# Patient Record
Sex: Female | Born: 1945 | Race: White | Hispanic: No | Marital: Married | Smoking: Never smoker
Health system: Southern US, Community
[De-identification: ages and names within clinical notes are randomized; demographics above are authoritative.]

## PROBLEM LIST (undated history)

## (undated) DIAGNOSIS — E785 Hyperlipidemia, unspecified: Secondary | ICD-10-CM

## (undated) HISTORY — DX: Hyperlipidemia, unspecified: E78.5

## (undated) SURGERY — Surgical Case
Anesthesia: *Unknown

---

## 1990-10-19 HISTORY — PX: OTHER SURGICAL HISTORY: SHX169

## 1999-01-06 ENCOUNTER — Other Ambulatory Visit: Admission: RE | Admit: 1999-01-06 | Discharge: 1999-01-06 | Payer: Self-pay | Admitting: Obstetrics and Gynecology

## 2000-10-28 ENCOUNTER — Other Ambulatory Visit: Admission: RE | Admit: 2000-10-28 | Discharge: 2000-10-28 | Payer: Self-pay | Admitting: Obstetrics and Gynecology

## 2001-11-07 ENCOUNTER — Other Ambulatory Visit: Admission: RE | Admit: 2001-11-07 | Discharge: 2001-11-07 | Payer: Self-pay | Admitting: Obstetrics and Gynecology

## 2002-11-08 ENCOUNTER — Other Ambulatory Visit: Admission: RE | Admit: 2002-11-08 | Discharge: 2002-11-08 | Payer: Self-pay | Admitting: Obstetrics and Gynecology

## 2003-11-12 ENCOUNTER — Other Ambulatory Visit: Admission: RE | Admit: 2003-11-12 | Discharge: 2003-11-12 | Payer: Self-pay | Admitting: Obstetrics and Gynecology

## 2004-11-12 ENCOUNTER — Other Ambulatory Visit: Admission: RE | Admit: 2004-11-12 | Discharge: 2004-11-12 | Payer: Self-pay | Admitting: Obstetrics and Gynecology

## 2005-11-17 ENCOUNTER — Other Ambulatory Visit: Admission: RE | Admit: 2005-11-17 | Discharge: 2005-11-17 | Payer: Self-pay | Admitting: Obstetrics and Gynecology

## 2006-04-02 ENCOUNTER — Ambulatory Visit: Payer: Self-pay | Admitting: Internal Medicine

## 2006-06-02 ENCOUNTER — Encounter: Admission: RE | Admit: 2006-06-02 | Discharge: 2006-06-02 | Payer: Self-pay | Admitting: Surgery

## 2006-06-04 ENCOUNTER — Ambulatory Visit (HOSPITAL_BASED_OUTPATIENT_CLINIC_OR_DEPARTMENT_OTHER): Admission: RE | Admit: 2006-06-04 | Discharge: 2006-06-04 | Payer: Self-pay | Admitting: Surgery

## 2006-06-04 ENCOUNTER — Encounter (INDEPENDENT_AMBULATORY_CARE_PROVIDER_SITE_OTHER): Payer: Self-pay | Admitting: Specialist

## 2006-10-18 ENCOUNTER — Ambulatory Visit: Payer: Self-pay | Admitting: Pulmonary Disease

## 2006-10-18 ENCOUNTER — Inpatient Hospital Stay (HOSPITAL_COMMUNITY): Admission: EM | Admit: 2006-10-18 | Discharge: 2006-10-19 | Payer: Self-pay | Admitting: Emergency Medicine

## 2006-11-23 ENCOUNTER — Other Ambulatory Visit: Admission: RE | Admit: 2006-11-23 | Discharge: 2006-11-23 | Payer: Self-pay | Admitting: Obstetrics and Gynecology

## 2007-08-25 ENCOUNTER — Ambulatory Visit: Payer: Self-pay | Admitting: Internal Medicine

## 2007-09-22 ENCOUNTER — Ambulatory Visit: Payer: Self-pay | Admitting: Internal Medicine

## 2007-12-19 ENCOUNTER — Other Ambulatory Visit: Admission: RE | Admit: 2007-12-19 | Discharge: 2007-12-19 | Payer: Self-pay | Admitting: Obstetrics and Gynecology

## 2008-08-15 ENCOUNTER — Ambulatory Visit: Payer: Self-pay | Admitting: Internal Medicine

## 2008-11-22 ENCOUNTER — Ambulatory Visit: Payer: Self-pay | Admitting: Internal Medicine

## 2008-11-22 DIAGNOSIS — R030 Elevated blood-pressure reading, without diagnosis of hypertension: Secondary | ICD-10-CM

## 2008-11-22 DIAGNOSIS — M542 Cervicalgia: Secondary | ICD-10-CM | POA: Insufficient documentation

## 2008-11-22 DIAGNOSIS — R519 Headache, unspecified: Secondary | ICD-10-CM | POA: Insufficient documentation

## 2008-11-22 DIAGNOSIS — R51 Headache: Secondary | ICD-10-CM

## 2009-01-23 ENCOUNTER — Other Ambulatory Visit: Admission: RE | Admit: 2009-01-23 | Discharge: 2009-01-23 | Payer: Self-pay | Admitting: Obstetrics and Gynecology

## 2009-01-23 ENCOUNTER — Ambulatory Visit: Payer: Self-pay | Admitting: Obstetrics and Gynecology

## 2009-01-23 ENCOUNTER — Encounter: Payer: Self-pay | Admitting: Obstetrics and Gynecology

## 2009-08-12 ENCOUNTER — Ambulatory Visit: Payer: Self-pay | Admitting: Internal Medicine

## 2009-12-05 ENCOUNTER — Encounter: Admission: RE | Admit: 2009-12-05 | Discharge: 2009-12-05 | Payer: Self-pay | Admitting: Obstetrics and Gynecology

## 2010-02-24 ENCOUNTER — Ambulatory Visit: Payer: Self-pay | Admitting: Obstetrics and Gynecology

## 2010-02-24 ENCOUNTER — Other Ambulatory Visit: Admission: RE | Admit: 2010-02-24 | Discharge: 2010-02-24 | Payer: Self-pay | Admitting: Obstetrics and Gynecology

## 2010-03-31 ENCOUNTER — Ambulatory Visit: Payer: Self-pay | Admitting: Internal Medicine

## 2010-03-31 DIAGNOSIS — Z902 Acquired absence of lung [part of]: Secondary | ICD-10-CM

## 2010-03-31 DIAGNOSIS — G4736 Sleep related hypoventilation in conditions classified elsewhere: Secondary | ICD-10-CM

## 2010-03-31 DIAGNOSIS — Z9889 Other specified postprocedural states: Secondary | ICD-10-CM | POA: Insufficient documentation

## 2010-04-07 ENCOUNTER — Telehealth (INDEPENDENT_AMBULATORY_CARE_PROVIDER_SITE_OTHER): Payer: Self-pay | Admitting: *Deleted

## 2010-04-09 ENCOUNTER — Encounter: Payer: Self-pay | Admitting: Internal Medicine

## 2010-04-14 ENCOUNTER — Ambulatory Visit: Payer: Self-pay | Admitting: Obstetrics and Gynecology

## 2010-08-04 ENCOUNTER — Ambulatory Visit: Payer: Self-pay | Admitting: Internal Medicine

## 2010-11-18 NOTE — Assessment & Plan Note (Signed)
Summary: sleep apnea?/cbs   Vital Signs:  Patient profile:   65 year old female Weight:      149 pounds Temp:     98.6 degrees F oral Pulse rate:   64 / minute Resp:     15 per minute BP sitting:   124 / 78  (left arm) Cuff size:   regular  Vitals Entered By: Shonna Chock (March 31, 2010 3:14 PM) CC: Sleep Concerns per patient's husband   CC:  Sleep Concerns per patient's husband.  History of Present Illness: Her husband is concerned she may have Sleep Apnea; she  may "snort" according to him. He had considered buying a pulse oximetry. She is unaware of any issues with sleep , but admits to breathing "shallowly".  Allergies (verified): No Known Drug Allergies  Review of Systems General:  Denies fatigue. ENT:  Complains of postnasal drainage. CV:  Complains of shortness of breath with exertion; denies difficulty breathing at night, difficulty breathing while lying down, swelling of feet, and swelling of hands; DOE in Summer due to absence of 1 lung. Resp:  Denies cough, excessive snoring, hypersomnolence, morning headaches, shortness of breath, and sputum productive. Neuro:  paresthesias R posterior neck occasionally. Allergy:  Denies itching eyes and sneezing.  Physical Exam  General:  well-nourished,in no acute distress; alert,appropriate and cooperative throughout examination Nose:  External nasal examination shows no deformity or inflammation. Nasal mucosa are pink and moist without lesions or exudates. Mouth:  Oral mucosa and oropharynx without lesions or exudates.  Teeth in good repair. Neck:  Trach scar ant neck Lungs:  Bronchovesicular BS RUL posteriorly; L lung clear Heart:  Normal rate and regular rhythm. S1 and S2 normal without gallop, murmur, click, rub,S4 Extremities:  No clubbing, cyanosis, edema. Neurologic:  alert & oriented X3, strength normal in all extremities, and DTRs symmetrical and normal.     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 Referral (Misc. 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 Referral (Misc. Ref)  Patient Instructions: 1)  Neti pot once daily as needed for head congestion. Keep diary of neck symptoms to establish any triggers.

## 2010-11-18 NOTE — Procedures (Signed)
Summary: Oximetry/[facility]  Oximetry/[facility]   Imported By: Lanelle Bal 04/28/2010 12:16:41  _____________________________________________________________________  External Attachment:    Type:   Image     Comment:   External Document

## 2010-11-18 NOTE — Progress Notes (Signed)
Summary: Status of Overnight O2 set-up  Phone Note Call from Patient Call back at Home Phone [phone]   Caller: Patient Summary of Call: Message left on  Triage VM: Patient was suppose to have over night O2 Study and has not heard anything.   I reviewed orders Tab, Betsy from [facility] was contacted and was to futher f/u with the patient.  I called Betsy at [phone], Corky Mull will follow-up and indicated patient should of been contacted by now but she will make sure overnight O2 Study is set up.  I called patient and left message on her VM informing her [facility] will contact her. Initial call taken by: Shonna Chock,  April 07, 2010 11:34 AM

## 2010-11-18 NOTE — Assessment & Plan Note (Signed)
Summary: flu shot/kn  Nurse Visit  CC: Flu shot./kb   Allergies: No Known Drug Allergies  Orders Added: 1)  Admin 1st Vaccine [90471] 2)  Flu Vaccine 4yrs + [16109]          Flu Vaccine Consent Questions     Do you have a history of severe allergic reactions to this vaccine? no    Any prior history of allergic reactions to egg and/or gelatin? no    Do you have a sensitivity to the preservative Thimersol? no    Do you have a past history of Guillan-Barre Syndrome? no    Do you currently have an acute febrile illness? no    Have you ever had a severe reaction to latex? no    Vaccine information given and explained to patient? yes    Are you currently pregnant? no    Lot Number:[id number]   Exp Date:04/18/2011   Site Given  Left Deltoid IMu

## 2011-03-06 NOTE — Op Note (Signed)
Erika Walker, Erika Walker             ACCOUNT NO.:  [id number]   MEDICAL RECORD NO.:  [id number]          PATIENT TYPE:  AMB   LOCATION:  [facility]                         FACILITY:  [facility]   PHYSICIAN:  Thomas A. Cornett, M.D.DATE OF BIRTH:  [birth year]   DATE OF PROCEDURE:  06/04/2006  DATE OF DISCHARGE:                                 OPERATIVE REPORT   PREOPERATIVE DIAGNOSIS:  Lipoma left lower back measuring 4 x 5 cm.   POSTOPERATIVE DIAGNOSIS:  Lipoma left lower back measuring 4 x 5 cm.   PROCEDURE:  Excision of lipoma lower back left measuring 4 x 5 cm.   SURGEON:  Maisie Fus A. Cornett, M.D.   ANESTHESIA:  Is MAC with 30 mL of 0.25% Sensorcaine with epinephrine.   SPECIMEN:  Is a 4 x 5 cm mass consistent with lipoma to pathology.   DRAINS:  None.   INDICATIONS FOR PROCEDURE:  The patient is a 65 year old female who has a  slowly enlarging mass to her left lower back.  This was diagnosed years ago  and felt to be consistent with a lipoma.  It has gotten larger and she  wished to have it removed due to discomfort.   DESCRIPTION OF PROCEDURE:  The patient brought to the operating room, placed  prone.  MAC anesthesia initiated and left lower back was prepped, draped in  sterile fashion.  0.25% Sensorcaine was used for local anesthesia.  A  vertical incision was made just left of the midline over the mass.  Dissection was carried down and I encountered a large lobulated fatty mass  which I excised in its entirety in the subcutaneous tissues.  This was sent  to pathology for further evaluation.  No irrigation was used.  Wound was  cleaned with no evidence of bleeding.  I closed in layers using 3-0 Vicryl  for deep layer to close the dead space and a 4-0 Monocryl for skin.  Steri-  Strips and dry dressings were applied.  All final counts, sponge, needle and  instruments were found be correct at this portion of the case.  The patient  was awoke and taken to recovery in satisfactory  condition.      Thomas A. Cornett, M.D.  Electronically Signed     TAC/MEDQ  D:  06/04/2006  T:  06/04/2006  Job:  [id number]   cc:   Reuel Boom L. Eda Paschal, M.D.  Fax: [phone]

## 2011-03-06 NOTE — Discharge Summary (Signed)
[name], Erika Walker NO.:  [id number]   MEDICAL RECORD NO.:  [id number]          PATIENT TYPE:  INP   LOCATION:  2927                         FACILITY:  [facility]   PHYSICIAN:  Gailen Shelter, MD  DATE OF BIRTH:  11-20-1945   DATE OF ADMISSION:  10/18/2006  DATE OF DISCHARGE:  10/19/2006                               DISCHARGE SUMMARY   DISCHARGE DIAGNOSES:  1. Acute hypercarbic respiratory failure due to upper airway      obstruction.  2. Benign aspiration due to food bolus aspiration.  3. Negative pressure pulmonary edema secondary to upper airway      obstruction resolved.  4. Status post traumatic right pneumonectomy.   BRIEF HISTORY AND PHYSICAL:  This is a 65 year old white female status  post traumatic right pneumonectomy in 1992 presented to the emergency  room nearly apneic with myoclonic jerks and cyanosis after choking on  spaghetti.  The ED P.M.D. gave her Ativan to relax the airway and used a  GlideScope to attempt intubation at that time.  He suctioned large  amounts of spaghetti from the patient's pharynx.  At that time, the  patient gasped and resumed normal breathing.  The patient did not  require intubation at that point.  For further details of the history  and physical, please refer to the note on the chart dated October 18, 2006.   BRIEF HOSPITAL COURSE:  The patient was admitted for observation status.  She was noted to have a left lower lobe infiltrate initially.  Subsequent chest x-ray on the day of discharge was noted to be clear.  Her arterial blood gases were also to her baseline.  Her examination at  that point was benign according to the rounding physician.  For this  purposes, the patient resolved the above issues and showed no sequelae  of her aspiration episode.   She was discharged to home in stable and improved condition.  She was  shown her preadmission medications which included only multivitamins.  Next followup was to  be done with either Dr. Drue Novel or Dr. Alwyn Ren whom the  patient identifies as her primary care physicians.      Gailen Shelter, MD  Electronically Signed     CLG/MEDQ  D:  12/13/2006  T:  12/13/2006  Job:  [id number]   cc:   Willow Ora, MD  Titus Dubin. Alwyn Ren, MD,FACP,FCCP

## 2011-03-12 ENCOUNTER — Encounter: Payer: Self-pay | Admitting: Obstetrics and Gynecology

## 2011-03-19 ENCOUNTER — Other Ambulatory Visit: Payer: Self-pay | Admitting: Obstetrics and Gynecology

## 2011-03-19 ENCOUNTER — Encounter (INDEPENDENT_AMBULATORY_CARE_PROVIDER_SITE_OTHER): Payer: BC Managed Care – PPO | Admitting: Obstetrics and Gynecology

## 2011-03-19 ENCOUNTER — Other Ambulatory Visit (HOSPITAL_COMMUNITY)
Admission: RE | Admit: 2011-03-19 | Discharge: 2011-03-19 | Disposition: A | Discharge status: Home / Self Care | Payer: BC Managed Care – PPO | Source: Ambulatory Visit | Attending: Obstetrics and Gynecology | Admitting: Obstetrics and Gynecology

## 2011-03-19 DIAGNOSIS — R82998 Other abnormal findings in urine: Secondary | ICD-10-CM

## 2011-03-19 DIAGNOSIS — Z124 Encounter for screening for malignant neoplasm of cervix: Secondary | ICD-10-CM | POA: Insufficient documentation

## 2011-03-19 DIAGNOSIS — Z01419 Encounter for gynecological examination (general) (routine) without abnormal findings: Secondary | ICD-10-CM

## 2011-06-08 ENCOUNTER — Encounter: Payer: Self-pay | Admitting: Family Medicine

## 2011-06-08 ENCOUNTER — Ambulatory Visit (INDEPENDENT_AMBULATORY_CARE_PROVIDER_SITE_OTHER)
Admission: RE | Admit: 2011-06-08 | Discharge: 2011-06-08 | Disposition: A | Discharge status: Home / Self Care | Payer: BC Managed Care – PPO | Source: Ambulatory Visit | Attending: Family Medicine | Admitting: Family Medicine

## 2011-06-08 ENCOUNTER — Ambulatory Visit (INDEPENDENT_AMBULATORY_CARE_PROVIDER_SITE_OTHER): Payer: BC Managed Care – PPO | Admitting: Family Medicine

## 2011-06-08 VITALS — BP 144/90 | Temp 98.8°F | Wt 151.2 lb

## 2011-06-08 DIAGNOSIS — R05 Cough: Secondary | ICD-10-CM

## 2011-06-08 MED ORDER — MOMETASONE FUROATE 50 MCG/ACT NA SUSP: 2.0000 | Freq: Every day | NASAL | Status: DC

## 2011-06-08 NOTE — Progress Notes (Signed)
  Subjective:    Patient ID: Erika Walker, female    DOB: 1946-02-14, 65 y.o.   MRN: [id number]  HPI Cough- sxs started 10 days ago.  Reports feeling well but cough unchanged.  Cough is intermittently productive.  No fever.  Minimal nasal congestion.  Will wake at 5am coughing.  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  No known sick contacts.  Hx of 'ongoing nasal drip'   Review of Systems For ROS see HPI     Objective:   Physical Exam  Vitals reviewed. Constitutional: She appears well-developed and well-nourished.  HENT:  Head: Normocephalic and atraumatic.  Nose: Nose normal.       No TTP over sinuses TMs normal bilaterally + PND  Eyes: Conjunctivae and EOM are normal. Pupils are equal, round, and reactive to light.  Neck: Normal range of motion. Neck supple.  Cardiovascular: Normal rate, regular rhythm and normal heart sounds.   Pulmonary/Chest: Effort normal. No respiratory distress. She has no wheezes. She has no rales.       Absent breath sounds on R- s/p pneumonectomy  Lymphadenopathy:    She has no cervical adenopathy.          Assessment & Plan:

## 2011-06-08 NOTE — Patient Instructions (Signed)
We will notify you of your xray results and start antibiotics as needed Start the nasal spray- 2 sprays each nostril daily Add Claritin or Zyrtec daily for control of postnasal drainage- store brands work just as well Drink plenty of fluids Call with any questions or concerns Hang in there!!!

## 2011-06-09 ENCOUNTER — Telehealth: Payer: Self-pay

## 2011-06-09 NOTE — Assessment & Plan Note (Signed)
Most likely due to untreated PND but given fact pt only has 1 lung will get CXR to assess.  If infxn on xray will proceed w/ abx.  Otherwise will use nasal steroid, OTC antihistamine to tx PND.  Reviewed supportive care and red flags that should prompt return.  Pt expressed understanding and is in agreement w/ plan.

## 2011-06-09 NOTE — Telephone Encounter (Signed)
Pt.notified

## 2011-06-09 NOTE — Telephone Encounter (Signed)
Message copied by Beverely Low on Tue Jun 09, 2011  8:29 AM ------      Message from: Sheliah Hatch      Created: Mon Jun 08, 2011  4:49 PM       CXR is clear- no evidence of infection.  This is good news!  Please call pt.

## 2011-07-15 ENCOUNTER — Telehealth: Payer: Self-pay | Admitting: Internal Medicine

## 2011-07-15 NOTE — Telephone Encounter (Signed)
Last pneumonia 08-25-07.Please advise

## 2011-07-15 NOTE — Telephone Encounter (Signed)
Pneumonia vaccine up to date; I strongly recommend the shingles vaccine. Shingles can be  extremely painful.

## 2011-07-15 NOTE — Telephone Encounter (Signed)
Left message to call office

## 2011-07-16 NOTE — Telephone Encounter (Signed)
Pt aware.

## 2011-07-22 ENCOUNTER — Ambulatory Visit (INDEPENDENT_AMBULATORY_CARE_PROVIDER_SITE_OTHER): Payer: BC Managed Care – PPO

## 2011-07-22 DIAGNOSIS — Z23 Encounter for immunization: Secondary | ICD-10-CM

## 2011-08-18 ENCOUNTER — Ambulatory Visit (INDEPENDENT_AMBULATORY_CARE_PROVIDER_SITE_OTHER): Payer: BC Managed Care – PPO

## 2011-08-18 DIAGNOSIS — Z23 Encounter for immunization: Secondary | ICD-10-CM

## 2012-03-23 ENCOUNTER — Encounter: Payer: Self-pay | Admitting: Obstetrics and Gynecology

## 2012-03-23 ENCOUNTER — Ambulatory Visit (INDEPENDENT_AMBULATORY_CARE_PROVIDER_SITE_OTHER): Payer: Medicare Other | Admitting: Obstetrics and Gynecology

## 2012-03-23 VITALS — BP 120/76 | Ht 66.0 in | Wt 150.0 lb

## 2012-03-23 DIAGNOSIS — Z01419 Encounter for gynecological examination (general) (routine) without abnormal findings: Secondary | ICD-10-CM | POA: Diagnosis not present

## 2012-03-23 DIAGNOSIS — R350 Frequency of micturition: Secondary | ICD-10-CM | POA: Diagnosis not present

## 2012-03-23 DIAGNOSIS — N952 Postmenopausal atrophic vaginitis: Secondary | ICD-10-CM | POA: Diagnosis not present

## 2012-03-23 NOTE — Progress Notes (Signed)
Patient came to see me today for further followup. She is very healthy without any significant problems. She takes no medication. She is having elevated cholesterol in the past which she will address  with Dr. Alwyn Ren. She is having no hot flashes. She is having no vaginal bleeding. She is having no pelvic pain. She does have vaginal dryness but uses lubricant with intercourse. She had a normal bone density in 2009. She is due for a mammogram. We have discussed colonoscopy which she has not done yet. She does have some urgency of urination without incontinence. She has no dysuria or frequency or hematuria. Patient has never had an abnormal Pap smear.  ROS: 12 system review done. 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 Neck pain. Chronic cough. All other pertinent positives above.  Physical examination: Kennon Portela present. HEENT within normal limits. Neck: Thyroid not large. No masses. Supraclavicular nodes: not enlarged. Breasts: Examined in both sitting and lying  position. No skin changes and no masses. Abdomen: Soft no guarding rebound or masses or hernia. Pelvic: External: Within normal limits. BUS: Within normal limits. Vaginal:within normal limits. poor estrogen effect. No evidence of cystocele rectocele or enterocele. Cervix: clean. Uterus: Normal size and shape. Adnexa: No masses. Rectovaginal exam: Confirmatory and negative. Extremities: Within normal limits.  Assessment: #1. Atrophic vaginitis #2. Urgency of urination  Plan: Call when necessary need for treatment of above. Mammogram. Colonoscopy.

## 2012-03-24 LAB — URINALYSIS W MICROSCOPIC + REFLEX CULTURE
Bilirubin Urine: NEGATIVE
Casts: NONE SEEN
Crystals: NONE SEEN
Ketones, ur: NEGATIVE mg/dL
Nitrite: NEGATIVE
Protein, ur: NEGATIVE mg/dL
Squamous Epithelial / LPF: NONE SEEN
Urobilinogen, UA: 0.2 mg/dL (ref 0.0–1.0)
pH: 5.5 (ref 5.0–8.0)

## 2012-04-25 DIAGNOSIS — H43819 Vitreous degeneration, unspecified eye: Secondary | ICD-10-CM | POA: Diagnosis not present

## 2012-04-25 DIAGNOSIS — H2510 Age-related nuclear cataract, unspecified eye: Secondary | ICD-10-CM | POA: Diagnosis not present

## 2012-04-25 DIAGNOSIS — H5200 Hypermetropia, unspecified eye: Secondary | ICD-10-CM | POA: Diagnosis not present

## 2012-04-25 DIAGNOSIS — H524 Presbyopia: Secondary | ICD-10-CM | POA: Diagnosis not present

## 2012-04-29 ENCOUNTER — Other Ambulatory Visit: Payer: Self-pay

## 2012-04-29 DIAGNOSIS — D485 Neoplasm of uncertain behavior of skin: Secondary | ICD-10-CM | POA: Diagnosis not present

## 2012-04-29 DIAGNOSIS — D1800 Hemangioma unspecified site: Secondary | ICD-10-CM | POA: Diagnosis not present

## 2012-04-29 DIAGNOSIS — L819 Disorder of pigmentation, unspecified: Secondary | ICD-10-CM | POA: Diagnosis not present

## 2012-07-29 ENCOUNTER — Telehealth: Payer: Self-pay | Admitting: Internal Medicine

## 2012-07-29 NOTE — Telephone Encounter (Signed)
pt needs to know when she is due for her Pneumonia shot & if she is due can she get it with her Flu Shot--Please review and advise and I will call her back to schedule ALSO note pt stated she only has 1-lung Cb# [phone]

## 2012-07-29 NOTE — Telephone Encounter (Signed)
Pneumonia vaccine is recommended once after age 66. I recommend flu and pneumonia vaccines be given @ different times in case there is any significant rash or fever following injection.

## 2012-08-03 ENCOUNTER — Telehealth: Payer: Self-pay | Admitting: Internal Medicine

## 2012-08-03 NOTE — Telephone Encounter (Signed)
Patient can get Flu vaccine and pneumonia vaccine (NOT AT THE SAME TIME) 1-2 weeks apart   Please contact patient and schedule appointment

## 2012-08-03 NOTE — Telephone Encounter (Signed)
Pt called back LM for me to call-called pt at 139pm  Flu 10.29 & PNA 11.12

## 2012-08-03 NOTE — Telephone Encounter (Signed)
done

## 2012-08-03 NOTE — Telephone Encounter (Signed)
Message copied by Verner Chol on Wed Aug 03, 2012  1:42 PM ------      Message from: [place], [place]      Created: Wed Aug 03, 2012 12:07 PM      Regarding: pt returning call      Contact: [phone]       Pt returning your call- wants to make appts- I could have done this but wanted to confirm with you first to make sure there wasn't anything additional going on. amy

## 2012-08-03 NOTE — Telephone Encounter (Signed)
Called pt at number given, left mess to call back & schedule flu & shingles vaccine at least 2-wks apart-smc

## 2012-08-08 ENCOUNTER — Telehealth: Payer: Self-pay | Admitting: Internal Medicine

## 2012-08-08 NOTE — Telephone Encounter (Signed)
DONE

## 2012-08-08 NOTE — Telephone Encounter (Signed)
Message copied by Verner Chol on Mon Aug 08, 2012  9:03 AM ------      Message from: [place], [place]      Created: Wed Aug 03, 2012 12:07 PM      Regarding: pt returning call      Contact: [phone]       Pt returning your call- wants to make appts- I could have done this but wanted to confirm with you first to make sure there wasn't anything additional going on. amy

## 2012-08-16 ENCOUNTER — Ambulatory Visit (INDEPENDENT_AMBULATORY_CARE_PROVIDER_SITE_OTHER): Payer: Medicare Other

## 2012-08-16 DIAGNOSIS — Z23 Encounter for immunization: Secondary | ICD-10-CM | POA: Diagnosis not present

## 2012-08-30 ENCOUNTER — Ambulatory Visit (INDEPENDENT_AMBULATORY_CARE_PROVIDER_SITE_OTHER): Payer: Medicare Other

## 2012-08-30 DIAGNOSIS — Z23 Encounter for immunization: Secondary | ICD-10-CM

## 2012-10-19 HISTORY — PX: COLONOSCOPY: SHX174

## 2012-11-23 ENCOUNTER — Emergency Department (HOSPITAL_COMMUNITY)
Admission: EM | Admit: 2012-11-23 | Discharge: 2012-11-23 | Disposition: A | Discharge status: Home / Self Care | Payer: Medicare Other | Attending: Emergency Medicine | Admitting: Emergency Medicine

## 2012-11-23 ENCOUNTER — Encounter (HOSPITAL_COMMUNITY): Payer: Self-pay | Admitting: Unknown Physician Specialty

## 2012-11-23 ENCOUNTER — Emergency Department (HOSPITAL_COMMUNITY): Payer: Medicare Other

## 2012-11-23 DIAGNOSIS — T783XXA Angioneurotic edema, initial encounter: Secondary | ICD-10-CM | POA: Diagnosis not present

## 2012-11-23 DIAGNOSIS — Z902 Acquired absence of lung [part of]: Secondary | ICD-10-CM | POA: Insufficient documentation

## 2012-11-23 DIAGNOSIS — R0989 Other specified symptoms and signs involving the circulatory and respiratory systems: Secondary | ICD-10-CM | POA: Insufficient documentation

## 2012-11-23 DIAGNOSIS — R0602 Shortness of breath: Secondary | ICD-10-CM | POA: Diagnosis not present

## 2012-11-23 DIAGNOSIS — R0609 Other forms of dyspnea: Secondary | ICD-10-CM | POA: Insufficient documentation

## 2012-11-23 DIAGNOSIS — IMO0002 Reserved for concepts with insufficient information to code with codable children: Secondary | ICD-10-CM | POA: Insufficient documentation

## 2012-11-23 DIAGNOSIS — T4995XA Adverse effect of unspecified topical agent, initial encounter: Secondary | ICD-10-CM | POA: Insufficient documentation

## 2012-11-23 DIAGNOSIS — Y9389 Activity, other specified: Secondary | ICD-10-CM | POA: Insufficient documentation

## 2012-11-23 DIAGNOSIS — Y9229 Other specified public building as the place of occurrence of the external cause: Secondary | ICD-10-CM | POA: Insufficient documentation

## 2012-11-23 DIAGNOSIS — D7289 Other specified disorders of white blood cells: Secondary | ICD-10-CM | POA: Diagnosis not present

## 2012-11-23 DIAGNOSIS — J9600 Acute respiratory failure, unspecified whether with hypoxia or hypercapnia: Secondary | ICD-10-CM | POA: Diagnosis not present

## 2012-11-23 LAB — POCT I-STAT, CHEM 8
Creatinine, Ser: 1.1 mg/dL (ref 0.50–1.10)
HCT: 45 % (ref 36.0–46.0)
Hemoglobin: 15.3 g/dL — ABNORMAL HIGH (ref 12.0–15.0)
Sodium: 139 mEq/L (ref 135–145)

## 2012-11-23 LAB — CBC WITH DIFFERENTIAL/PLATELET
Eosinophils Absolute: 0.2 10*3/uL (ref 0.0–0.7)
Lymphocytes Relative: 49 % — ABNORMAL HIGH (ref 12–46)
MCH: 31.9 pg (ref 26.0–34.0)
Monocytes Absolute: 0.9 10*3/uL (ref 0.1–1.0)
Neutro Abs: 6.5 10*3/uL (ref 1.7–7.7)
Platelets: 242 10*3/uL (ref 150–400)
WBC: 15.2 10*3/uL — ABNORMAL HIGH (ref 4.0–10.5)

## 2012-11-23 MED ORDER — METHYLPREDNISOLONE SODIUM SUCC 125 MG IJ SOLR
125.0000 mg | Freq: Once | INTRAMUSCULAR | Status: AC
  Administered 2012-11-23: 125 mg via INTRAVENOUS
  Filled 2012-11-23: qty 2

## 2012-11-23 MED ORDER — RANITIDINE HCL 150 MG/10ML PO SYRP
150.0000 mg | ORAL_SOLUTION | Freq: Once | ORAL | Status: DC
  Filled 2012-11-23: qty 10

## 2012-11-23 MED ORDER — PREDNISONE 50 MG PO TABS: 50.0000 mg | ORAL_TABLET | Freq: Every day | ORAL | Status: DC

## 2012-11-23 MED ORDER — DIPHENHYDRAMINE HCL 25 MG PO CAPS
25.0000 mg | ORAL_CAPSULE | Freq: Once | ORAL | Status: DC
  Filled 2012-11-23: qty 1

## 2012-11-23 MED ORDER — FAMOTIDINE IN NACL 20-0.9 MG/50ML-% IV SOLN
20.0000 mg | Freq: Once | INTRAVENOUS | Status: AC
  Administered 2012-11-23: 20 mg via INTRAVENOUS
  Filled 2012-11-23: qty 50

## 2012-11-23 MED ORDER — SODIUM CHLORIDE 0.9 % IV SOLN: Freq: Once | INTRAVENOUS | Status: DC

## 2012-11-23 MED ORDER — DIPHENHYDRAMINE HCL 25 MG PO CAPS: 25.0000 mg | ORAL_CAPSULE | Freq: Four times a day (QID) | ORAL | Status: DC | PRN

## 2012-11-23 MED ORDER — EPINEPHRINE 0.3 MG/0.3ML IJ DEVI: 0.3000 mg | INTRAMUSCULAR | Status: DC | PRN

## 2012-11-23 MED ORDER — EPINEPHRINE 0.3 MG/0.3ML IJ DEVI
0.3000 mg | Freq: Once | INTRAMUSCULAR | Status: DC
  Filled 2012-11-23: qty 0.3

## 2012-11-23 MED ORDER — RANITIDINE HCL 150 MG PO TABS: 150.0000 mg | ORAL_TABLET | Freq: Two times a day (BID) | ORAL | Status: DC

## 2012-11-23 MED ORDER — DIPHENHYDRAMINE HCL 50 MG/ML IJ SOLN
25.0000 mg | Freq: Once | INTRAMUSCULAR | Status: AC
  Administered 2012-11-23: 25 mg via INTRAVENOUS
  Filled 2012-11-23: qty 1

## 2012-11-23 NOTE — ED Provider Notes (Signed)
History     CSN: [id number]  Arrival date & time 11/23/12  1241   First MD Initiated Contact with Patient 11/23/12 1256      Chief Complaint  Patient presents with  . Choking    (Consider location/radiation/quality/duration/timing/severity/associated sxs/prior treatment) HPI Comments: PT comes in with cc of respiratory distress. Pt was in the cafeteria, and started choking. She was eating pasta and chicken. She became tachypneic, turned red - and a nearby Physician performed heimlich maneuver -retrieving some pasta. Pt brought to the ED, still in respiratory distress, but stating that she felt better. Further suctioning in the ED led to more pasta being retrieved.  Pt reports that she has hx of right sided pneumonectomy and no med problems and no hx of allergies. She has had these episodes of "choking" in the past.  The history is provided by the patient and medical records.    History reviewed. No pertinent past medical history.  Past Surgical History  Procedure Date  . Lung removed     Family History  Problem Relation Age of Onset  . Cancer Brother     LIVER  . Diabetes Brother     History  Substance Use Topics  . Smoking status: Never Smoker   . Smokeless tobacco: Not on file  . Alcohol Use: 3.5 oz/week    7 drink(s) per week    OB History    Grav Para Term Preterm Abortions TAB SAB Ect Mult Living   3 3 3       3       Review of Systems  Constitutional: Positive for activity change. Negative for fever.  HENT: Positive for trouble swallowing and voice change. Negative for facial swelling, drooling, neck pain and neck stiffness.   Respiratory: Positive for shortness of breath.   Cardiovascular: Negative for chest pain.  Gastrointestinal: Negative for nausea, vomiting and abdominal pain.  Genitourinary: Negative for dysuria.  Skin: Negative for rash.  Neurological: Negative for headaches.    Allergies  Review of patient's allergies indicates no known  allergies.  Home Medications   Current Outpatient Rx  Name  Route  Sig  Dispense  Refill  . VITAMIN D PO   Oral   Take 1 tablet by mouth daily.         Marland Kitchen VITAMIN D-3 PO   Oral   Take 1 tablet by mouth daily.         Marland Kitchen OVER THE COUNTER MEDICATION   Oral   Take 1 capsule by mouth 2 (two) times daily. Fish oil with flax and borage oil.         Marland Kitchen RESVERATROL PO   Oral   Take 1 capsule by mouth daily.          Marland Kitchen DIPHENHYDRAMINE HCL 25 MG PO CAPS   Oral   Take 1 capsule (25 mg total) by mouth every 6 (six) hours as needed for itching.   30 capsule   0   . EPINEPHRINE 0.3 MG/0.3ML IJ DEVI   Intramuscular   Inject 0.3 mLs (0.3 mg total) into the muscle as needed.   2 Device   1   . PREDNISONE 50 MG PO TABS   Oral   Take 1 tablet (50 mg total) by mouth daily.   5 tablet   0   . RANITIDINE HCL 150 MG PO TABS   Oral   Take 1 tablet (150 mg total) by mouth 2 (two) times daily.   10  tablet   0     BP 122/75  Pulse 77  Temp 98.3 F (36.8 C) (Oral)  Resp 19  SpO2 97%  Physical Exam  Nursing note and vitals reviewed. Constitutional: She is oriented to person, place, and time. She appears well-developed and well-nourished.  HENT:  Head: Normocephalic and atraumatic.       The visible part of the oral airway showed no swelling/edema.   Eyes: EOM are normal. Pupils are equal, round, and reactive to light.  Neck: Neck supple.  Cardiovascular: Normal rate, regular rhythm and normal heart sounds.   No murmur heard. Pulmonary/Chest: She is in respiratory distress.       Poor air entry on the right side and mild wheezing. + stridor  Abdominal: Soft. She exhibits no distension. There is no tenderness. There is no rebound and no guarding.  Neurological: She is alert and oriented to person, place, and time.  Skin: Skin is warm and dry.    ED Course  Procedures (including critical care time)  Labs Reviewed  CBC WITH DIFFERENTIAL - Abnormal; Notable for the  following:    WBC 15.2 (*)     Lymphocytes Relative 49 (*)     Lymphs Abs 7.4 (*)     Basophils Absolute 0.2 (*)     All other components within normal limits  POCT I-STAT, CHEM 8 - Abnormal; Notable for the following:    Glucose, Bld 260 (*)     Hemoglobin 15.3 (*)     All other components within normal limits   Dg Chest Portable 1 View  11/23/2012  *RADIOLOGY REPORT*  Clinical Data: Respiratory distress.  Choking, status post Heimlich maneuver.  Remote right pneumonectomy.  PORTABLE CHEST - 1 VIEW  Comparison: 06/08/2011  Findings: Prior right pneumonectomy noted with expected volume loss.  Mild left apical pleuroparenchymal scarring appears stable.  Mild interstitial accentuation noted in the left lung, particularly at the lung base. Cardiac shadow is deviated to the right due to the volume loss in the right hemithorax, and obscured.  Old left midclavicular fracture noted.  IMPRESSION:  1.  Faint interstitial accentuation of the left lung base, potentially from mild interstitial edema or aggressive fluid replacement. Aspiration pneumonitis is not completely excluded medially at the left lung base, but is considered less likely.  A low threshold for follow-up radiography is suggested.   Original Report Authenticated By: Gaylyn Rong, M.D.      1. Angioedema       MDM  Pt comes in with cc of DIB. Pt feels like her airway is closing. She had a choking spell prior to arrival as well, s/p Heimlich and removal of the foreign body.  Our exam shows continues inspiratory stridor. DDX is still mechanical obstructions due to foreign body vs. Narrowing of the airway due to an allergic type rxn. I am favoring the latter, especially, as patient no longer feels like she has any difficulty swallowing, or is something is stuck in her throat.  Will give solumedrol with benadryl and pepcid. Epi im also ordered. ENT called  -they will see the patient after clinic unless there is an airway  emergency.  Pt in resp distress -with stridor. Will monitor closely.  LATE ENTRY: Pt observed closely for a period of 3 hours. I personally assessed her 2 times - and she appeared to be back to normal after about an hour of treatment. Epi pen was not required.  Unsure what the etiology was for the resp  distress - favoring angioedema. Pt asked to see an allergist, and her pcp for dynamic swallow study.   CRITICAL CARE Performed by: Derwood Kaplan   Total critical care time: 40 minutes  Critical care time was exclusive of separately billable procedures and treating other patients.  Critical care was necessary to treat or prevent imminent or life-threatening deterioration.  Critical care was time spent personally by me on the following activities: development of treatment plan with patient and/or surrogate as well as nursing, discussions with consultants, evaluation of patient's response to treatment, examination of patient, obtaining history from patient or surrogate, ordering and performing treatments and interventions, ordering and review of laboratory studies, ordering and review of radiographic studies, pulse oximetry and re-evaluation of patient's condition.   Derwood Kaplan, MD 11/24/12 [phone]

## 2012-11-23 NOTE — ED Notes (Signed)
Patient arrived via [facility] post choking while at lunch. Patient has a history of only having one left lung. Patient is is respiratory distress at this time. She arrived with NRB. Patient had food that come out of her nose and mouth post heimlich.

## 2012-11-24 LAB — PATHOLOGIST SMEAR REVIEW

## 2012-11-29 ENCOUNTER — Encounter: Payer: Self-pay | Admitting: Gastroenterology

## 2012-12-16 ENCOUNTER — Encounter: Payer: Self-pay | Admitting: Gastroenterology

## 2012-12-16 ENCOUNTER — Other Ambulatory Visit (HOSPITAL_COMMUNITY): Payer: Self-pay | Admitting: Gastroenterology

## 2012-12-16 ENCOUNTER — Ambulatory Visit (INDEPENDENT_AMBULATORY_CARE_PROVIDER_SITE_OTHER): Payer: Medicare Other | Admitting: Gastroenterology

## 2012-12-16 VITALS — BP 150/80 | HR 80 | Ht 65.5 in | Wt 153.0 lb

## 2012-12-16 DIAGNOSIS — R1310 Dysphagia, unspecified: Secondary | ICD-10-CM

## 2012-12-16 DIAGNOSIS — IMO0002 Reserved for concepts with insufficient information to code with codable children: Secondary | ICD-10-CM | POA: Diagnosis not present

## 2012-12-16 DIAGNOSIS — T17308A Unspecified foreign body in larynx causing other injury, initial encounter: Secondary | ICD-10-CM

## 2012-12-16 NOTE — Patient Instructions (Addendum)
Barium esophagram and modified barium swallow study speech pathology for dysphagia, choking. You may need upper endoscopy depending on these results.  You have been scheduled for a modified barium swallow/ barium swallow on 12/22/12 at 11:30am. Please arrive 15 minutes prior to your test for registration. You will go to [facility]  Radiology (1st Floor) for your appointment. Please refrain from eating or drinking anything 4 hours prior to your test. Should you need to cancel or reschedule your appointment, please contact [phone] [facility]) or [phone] [facility]). _____________________________________________________________________ A Modified Barium Swallow Study, or MBS, is a special x-ray that is taken to check swallowing skills. It is carried out by a Marine scientist and a Warehouse manager (SLP). During this test, yourmouth, throat, and esophagus, a muscular tube which connects your mouth to your stomach, is checked. The test will help you, your doctor, and the SLP plan what types of foods and liquids are easier for you to swallow. The SLP will also identify positions and ways to help you swallow more easily and safely. What will happen during an MBS? You will be taken to an x-ray room and seated comfortably. You will be asked to swallow small amounts of food and liquid mixed with barium. Barium is a liquid or paste that allows images of your mouth, throat and esophagus to be seen on x-ray. The x-ray captures moving images of the food you are swallowing as it travels from your mouth through your throat and into your esophagus. This test helps identify whether food or liquid is entering your lungs (aspiration). The test also shows which part of your mouth or throat lacks strength or coordination to move the food or liquid in the right direction. This test typically takes 30 minutes to 1 hour to complete. _______________________________________________________________________
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
## 2012-12-21 ENCOUNTER — Ambulatory Visit (HOSPITAL_COMMUNITY): Payer: Medicare Other

## 2012-12-22 ENCOUNTER — Ambulatory Visit (HOSPITAL_COMMUNITY)
Admission: RE | Admit: 2012-12-22 | Discharge: 2012-12-22 | Disposition: A | Discharge status: Home / Self Care | Payer: Medicare Other | Source: Ambulatory Visit | Attending: Gastroenterology | Admitting: Gastroenterology

## 2012-12-22 DIAGNOSIS — R1310 Dysphagia, unspecified: Secondary | ICD-10-CM

## 2012-12-22 DIAGNOSIS — K224 Dyskinesia of esophagus: Secondary | ICD-10-CM | POA: Diagnosis not present
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
## 2012-12-29 ENCOUNTER — Other Ambulatory Visit: Payer: Self-pay

## 2012-12-29 NOTE — Telephone Encounter (Signed)
error 

## 2013-01-10 ENCOUNTER — Telehealth: Payer: Self-pay | Admitting: Gastroenterology

## 2013-01-10 NOTE — Telephone Encounter (Signed)
Left message on machine to call back  

## 2013-01-10 NOTE — Telephone Encounter (Signed)
Pt wishes to call back and schedule the end of April

## 2013-01-17 ENCOUNTER — Ambulatory Visit (HOSPITAL_COMMUNITY): Payer: Medicare Other

## 2013-05-01 DIAGNOSIS — H5200 Hypermetropia, unspecified eye: Secondary | ICD-10-CM | POA: Diagnosis not present

## 2013-05-01 DIAGNOSIS — H259 Unspecified age-related cataract: Secondary | ICD-10-CM | POA: Diagnosis not present

## 2013-05-01 DIAGNOSIS — H524 Presbyopia: Secondary | ICD-10-CM | POA: Diagnosis not present

## 2013-06-13 ENCOUNTER — Telehealth: Payer: Self-pay | Admitting: Gastroenterology

## 2013-06-13 ENCOUNTER — Other Ambulatory Visit: Payer: Self-pay

## 2013-06-13 DIAGNOSIS — Z1231 Encounter for screening mammogram for malignant neoplasm of breast: Secondary | ICD-10-CM

## 2013-06-13 NOTE — Telephone Encounter (Signed)
Is this ok with you Dr Brodie? 

## 2013-06-13 NOTE — Telephone Encounter (Signed)
Is this ok with you Dr Jacobs? 

## 2013-06-13 NOTE — Telephone Encounter (Signed)
That is ok with me if it is OK with Dr. Juanda Chance.

## 2013-06-13 NOTE — Telephone Encounter (Signed)
OK  DB

## 2013-06-14 ENCOUNTER — Encounter: Payer: Self-pay | Admitting: Internal Medicine

## 2013-06-14 NOTE — Telephone Encounter (Signed)
Left a message for the pt to c/b and schedule her procedure with Dr. Juanda Chance.

## 2013-06-20 ENCOUNTER — Emergency Department (HOSPITAL_COMMUNITY): Payer: Medicare Other

## 2013-06-20 ENCOUNTER — Emergency Department (HOSPITAL_COMMUNITY)
Admission: EM | Admit: 2013-06-20 | Discharge: 2013-06-20 | Discharge status: Against Medical Advice | Payer: Medicare Other | Attending: Emergency Medicine | Admitting: Emergency Medicine

## 2013-06-20 ENCOUNTER — Encounter (HOSPITAL_COMMUNITY): Payer: Self-pay | Admitting: Emergency Medicine

## 2013-06-20 DIAGNOSIS — R079 Chest pain, unspecified: Secondary | ICD-10-CM | POA: Diagnosis not present

## 2013-06-20 DIAGNOSIS — R0602 Shortness of breath: Secondary | ICD-10-CM | POA: Diagnosis not present

## 2013-06-20 LAB — CBC
Hemoglobin: 14.9 g/dL (ref 12.0–15.0)
MCH: 31.6 pg (ref 26.0–34.0)
RBC: 4.71 MIL/uL (ref 3.87–5.11)
WBC: 9.9 10*3/uL (ref 4.0–10.5)

## 2013-06-20 LAB — BASIC METABOLIC PANEL
CO2: 22 mEq/L (ref 19–32)
Chloride: 105 mEq/L (ref 96–112)
Glucose, Bld: 177 mg/dL — ABNORMAL HIGH (ref 70–99)
Potassium: 3.3 mEq/L — ABNORMAL LOW (ref 3.5–5.1)
Sodium: 142 mEq/L (ref 135–145)

## 2013-06-20 LAB — POCT I-STAT TROPONIN I: Troponin i, poc: 0.03 ng/mL (ref 0.00–0.08)

## 2013-06-20 NOTE — ED Notes (Addendum)
Patient with sudden onset of shortness of breath and chest pain after eating a carrot stick.  Upon arrival to ED, patient was pale, diaphoretic and short of breath with stridor.  Patient is now without stridor, calm and breathing easy at this time.  Patient is now not having any shortness of breath.  States she does have some mucus in her throat.  Patient took epi pen before leaving home due to feeling of throat constriction.

## 2013-06-20 NOTE — ED Notes (Signed)
Pt states that she feels fine and will return if she starts to feel sob or like her heart is racing.

## 2013-06-20 NOTE — ED Notes (Signed)
At registration, frantic husband reported wife was sob. Pt/wife walking towards desk with increased wob & stridor. Epipen administered PTA. Also reports h/o lobectomy. Pt placed in w/c, placed on NRB. Upon heading to room pt coughed and felt instantly better. Pt states, "it cleared, I'm better". NRB remains in place. Pt taken to triage room. Husband at side. Pt calmer, NAD, speaking with husband in clear complete sentences. No stridor at present.

## 2013-06-26 ENCOUNTER — Ambulatory Visit (AMBULATORY_SURGERY_CENTER): Payer: Self-pay

## 2013-06-26 VITALS — Ht 66.0 in | Wt 151.4 lb

## 2013-06-26 DIAGNOSIS — Z1211 Encounter for screening for malignant neoplasm of colon: Secondary | ICD-10-CM

## 2013-06-26 MED ORDER — MOVIPREP 100 G PO SOLR: 1.0000 | Freq: Once | ORAL | Status: DC

## 2013-07-03 ENCOUNTER — Ambulatory Visit: Payer: Medicare Other

## 2013-07-04 ENCOUNTER — Ambulatory Visit
Admission: RE | Admit: 2013-07-04 | Discharge: 2013-07-04 | Disposition: A | Discharge status: Home / Self Care | Payer: Medicare Other | Source: Ambulatory Visit

## 2013-07-04 DIAGNOSIS — Z1231 Encounter for screening mammogram for malignant neoplasm of breast: Secondary | ICD-10-CM | POA: Diagnosis not present

## 2013-07-07 ENCOUNTER — Encounter: Payer: Self-pay | Admitting: Internal Medicine

## 2013-07-07 ENCOUNTER — Ambulatory Visit (AMBULATORY_SURGERY_CENTER): Payer: Medicare Other | Admitting: Internal Medicine

## 2013-07-07 VITALS — BP 157/66 | HR 60 | Temp 96.5°F | Resp 18 | Ht 66.0 in | Wt 151.0 lb

## 2013-07-07 DIAGNOSIS — Z1211 Encounter for screening for malignant neoplasm of colon: Secondary | ICD-10-CM

## 2013-07-07 DIAGNOSIS — F411 Generalized anxiety disorder: Secondary | ICD-10-CM | POA: Diagnosis not present

## 2013-07-07 MED ORDER — SODIUM CHLORIDE 0.9 % IV SOLN: 500.0000 mL | INTRAVENOUS | Status: DC

## 2013-07-07 NOTE — Op Note (Signed)
[facility] [address] [place], [address]   COLONOSCOPY PROCEDURE REPORT  PATIENT: Erika Walker, Erika Walker  MR#: [id number] BIRTHDATE: 1946/05/06 , 66  yrs. old GENDER: Female ENDOSCOPIST: Hart Carwin, MD REFERRED QI:ONGEXBM Alwyn Ren, M.D. PROCEDURE DATE:  07/07/2013 PROCEDURE:   Colonoscopy, screening First Screening Colonoscopy - Avg.  risk and is 50 yrs.  old or older Yes.  Prior Negative Screening - Now for repeat screening. N/A  History of Adenoma - Now for follow-up colonoscopy & has been > or = to 3 yrs.  N/A  Polyps Removed Today? No.  Recommend repeat exam, <10 yrs? No. ASA CLASS:   Class III INDICATIONS:Average risk patient for colon cancer. MEDICATIONS: MAC sedation, administered by CRNA and Propofol (Diprivan) 160 mg IV  DESCRIPTION OF PROCEDURE:   After the risks benefits and alternatives of the procedure were thoroughly explained, informed consent was obtained.  A digital rectal exam revealed no abnormalities of the rectum.   The LB [id number]  endoscope was introduced through the anus and advanced to the cecum, which was identified by both the appendix and ileocecal valve. No adverse events experienced.   The quality of the prep was excellent, using MoviPrep  The instrument was then slowly withdrawn as the colon was fully examined.      COLON FINDINGS: There was moderate diverticulosis noted throughout the entire examined colon with associated muscular hypertrophy, angulation and luminal narrowing.  Retroflexed views revealed no abnormalities. The time to cecum=4 minutes 50 seconds.  Withdrawal time=6 minutes 21 seconds.  The scope was withdrawn and the procedure completed. COMPLICATIONS: There were no complications.  ENDOSCOPIC IMPRESSION: There was moderate diverticulosis noted throughout the entire examined colon, more severe in the descending and sigmoid colon, no obstruction  RECOMMENDATIONS: 1.  High fiber diet 2.    Metamucil 1 tsp daily   eSigned:  Hart Carwin, MD 07/07/2013 11:52 AM   cc:   PATIENT NAME:  Erika Walker, Erika Walker MR#: [id number]

## 2013-07-07 NOTE — Progress Notes (Signed)
Patient did not experience any of the following events: a burn prior to discharge; a fall within the facility; wrong site/side/patient/procedure/implant event; or a hospital transfer or hospital admission upon discharge from the facility. (G8907) Patient did not have preoperative order for IV antibiotic SSI prophylaxis. (G8918)  

## 2013-07-07 NOTE — Progress Notes (Signed)
A/ox3 pleased with MAC, report to Celia RN 

## 2013-07-07 NOTE — Patient Instructions (Addendum)
Discharge instructions given with verbal understanding. Handouts on diverticulosis and a high fiber diet given. Resume previous medications.YOU HAD AN ENDOSCOPIC PROCEDURE TODAY AT THE [facility]: Refer to the procedure report that was given to you for any specific questions about what was found during the examination.  If the procedure report does not answer your questions, please call your gastroenterologist to clarify.  If you requested that your care partner not be given the details of your procedure findings, then the procedure report has been included in a sealed envelope for you to review at your convenience later.  YOU SHOULD EXPECT: Some feelings of bloating in the abdomen. Passage of more gas than usual.  Walking can help get rid of the air that was put into your GI tract during the procedure and reduce the bloating. If you had a lower endoscopy (such as a colonoscopy or flexible sigmoidoscopy) you may notice spotting of blood in your stool or on the toilet paper. If you underwent a bowel prep for your procedure, then you may not have a normal bowel movement for a few days.  DIET: Your first meal following the procedure should be a light meal and then it is ok to progress to your normal diet.  A half-sandwich or bowl of soup is an example of a good first meal.  Heavy or fried foods are harder to digest and may make you feel nauseous or bloated.  Likewise meals heavy in dairy and vegetables can cause extra gas to form and this can also increase the bloating.  Drink plenty of fluids but you should avoid alcoholic beverages for 24 hours.  ACTIVITY: Your care partner should take you home directly after the procedure.  You should plan to take it easy, moving slowly for the rest of the day.  You can resume normal activity the day after the procedure however you should NOT DRIVE or use heavy machinery for 24 hours (because of the sedation medicines used during the test).    SYMPTOMS TO  REPORT IMMEDIATELY: A gastroenterologist can be reached at any hour.  During normal business hours, 8:30 AM to 5:00 PM Monday through Friday, call [phone].  After hours and on weekends, please call the GI answering service at [phone] who will take a message and have the physician on call contact you.   Following lower endoscopy (colonoscopy or flexible sigmoidoscopy):  Excessive amounts of blood in the stool  Significant tenderness or worsening of abdominal pains  Swelling of the abdomen that is new, acute  Fever of 100F or higher  FOLLOW UP: If any biopsies were taken you will be contacted by phone or by letter within the next 1-3 weeks.  Call your gastroenterologist if you have not heard about the biopsies in 3 weeks.  Our staff will call the home number listed on your records the next business day following your procedure to check on you and address any questions or concerns that you may have at that time regarding the information given to you following your procedure. This is a courtesy call and so if there is no answer at the home number and we have not heard from you through the emergency physician on call, we will assume that you have returned to your regular daily activities without incident.  SIGNATURES/CONFIDENTIALITY: You and/or your care partner have signed paperwork which will be entered into your electronic medical record.  These signatures attest to the fact that that the information above on your   After Visit Summary has been reviewed and is understood.  Full responsibility of the confidentiality of this discharge information lies with you and/or your care-partner.   

## 2013-07-10 ENCOUNTER — Telehealth: Payer: Self-pay

## 2013-07-10 ENCOUNTER — Telehealth: Payer: Self-pay | Admitting: *Deleted

## 2013-07-10 DIAGNOSIS — R1310 Dysphagia, unspecified: Secondary | ICD-10-CM

## 2013-07-10 NOTE — Telephone Encounter (Signed)
Per patient she requests Dr. Juanda Chance. Per Chales Abrahams, patient was to switch to Dr. Juanda Chance. Scheduled EGD with dil on 08/09/13 at 3:30 PM and pre visit on 07/20/13 at 2:00 PM.

## 2013-07-10 NOTE — Telephone Encounter (Signed)
Left message on answering machine. 

## 2013-07-10 NOTE — Telephone Encounter (Signed)
Left a message for patient to call me. 

## 2013-07-10 NOTE — Telephone Encounter (Signed)
Message copied by Daphine Deutscher on Mon Jul 10, 2013 10:09 AM ------      Message from: Hart Carwin      Created: Fri Jul 07, 2013 12:46 PM      Regarding: EGD       Rene Kocher, pt wants an EGD. She is Dr Larae Grooms pt but requested colon from a female MD. Dr Christella Hartigan wanted her to have an EGD but she declined. Now she is agreeable to EGD/dil. Can you call her and ask her again whether she wants [place] to schedule it and with whom, ask Dr Christella Hartigan . Was it a switch of a provider or just switch for colonoscopy?? Thanx ------

## 2013-07-12 ENCOUNTER — Telehealth: Payer: Self-pay | Admitting: *Deleted

## 2013-07-12 NOTE — Telephone Encounter (Signed)
Message copied by Daphine Deutscher on Wed Jul 12, 2013  8:14 AM ------      Message from: Cathlyn Parsons      Created: Tue Jul 11, 2013  9:08 PM       Rene Kocher,            She is cleared for [employer],            John                  ----- Message -----         From: Daphine Deutscher, RN         Sent: 07/11/2013   8:23 AM           To: Cathlyn Parsons, CRNA            Jonny Ruiz,      This patient had a trach in the past. She is scheduled for EGD with Dil on 08/09/13. Just want to be sure she is ok for [facility].      Thanks,      Rene Kocher       ------

## 2013-07-19 ENCOUNTER — Encounter: Payer: Self-pay | Admitting: Internal Medicine

## 2013-07-19 ENCOUNTER — Ambulatory Visit (INDEPENDENT_AMBULATORY_CARE_PROVIDER_SITE_OTHER): Payer: Medicare Other | Admitting: Internal Medicine

## 2013-07-19 VITALS — BP 149/76 | HR 78 | Temp 98.0°F | Wt 148.2 lb

## 2013-07-19 DIAGNOSIS — Z902 Acquired absence of lung [part of]: Secondary | ICD-10-CM

## 2013-07-19 DIAGNOSIS — R1319 Other dysphagia: Secondary | ICD-10-CM | POA: Diagnosis not present

## 2013-07-19 DIAGNOSIS — R1310 Dysphagia, unspecified: Secondary | ICD-10-CM | POA: Insufficient documentation

## 2013-07-19 NOTE — Patient Instructions (Addendum)
Reflux of gastric acid may be asymptomatic as this may occur mainly during sleep.The triggers for reflux  include stress; the "aspirin family" ; alcohol; peppermint; and caffeine (coffee, tea, cola, and chocolate). The aspirin family would include aspirin and the nonsteroidal agents such as ibuprofen &  Naproxen. Tylenol would not cause reflux. If having symptoms ; food & drink should be avoided for @ least 2 hours before going to bed.   If you activate the  My Chart system; lab & Xray results will be released directly  to you as soon as I review & address these through the computer. If you choose not to sign up for My Chart within 36 hours of labs being drawn; results will be reviewed & interpretation added before being copied & mailed, causing a delay in getting the results to you.If you do not receive that report within 7-10 days ,please call. Additionally you can use this system to gain direct  access to your records  if  out of town or @ an office of a  physician who is not in  the My Chart network.  This improves continuity of care & places you in control of your medical record.

## 2013-07-19 NOTE — Progress Notes (Signed)
Subjective:    Patient ID: Erika Walker, female    DOB: 07-25-1946, 67 y.o.   MRN: [id number]  HPI She describes 2 episodes of acute respiratory compromise in February and September of this year in the context of eating. The first episode was related to eating pasta and the second while eating carrots. She has had some symptoms in the past when eating pasta but only one required an ER visit.  She feels these are related to talking while eating pasta.  She has had a barium swallow to evaluate the symptoms and this apparently was negative. Pending is an upper endoscopy by Dr. Juanda Chance.  She denies any associated angioedema symptoms.  At the first episode she was prescribed epinephrine which she's used with questionable response.   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 Otherwise she has dyspnea.  No PMH of asthma or smoking.    Review of Systems She denies associated itchy, watery eyes or sneezing symptoms.  She also denies associated chest pain, palpitations, edema, or paroxysmal nocturnal dyspnea.  She has no significant hoarseness, dyspepsia, abdominal pain, unexplained weight loss, melena, or rectal bleeding.      Objective:   Physical Exam Gen.: Thin but healthy and well-nourished in appearance. Alert, appropriate and cooperative throughout exam.Appears younger than stated age   Eyes: No corneal or conjunctival inflammation noted. Pupils equal round reactive to light and accommodation.   Nose: External nasal exam reveals no deformity or inflammation. Nasal mucosa are pink and moist. No lesions or exudates noted.  Mouth: Oral mucosa and oropharynx reveal no lesions or exudates. Teeth in good repair. Neck: No deformities, masses, or tenderness noted. Range of motion normal. Thyroid :difficult to palpate due to scar tissue on R. Lungs: Normal respiratory effort; chest  expands symmetrically. Lungs are clear to auscultation without rales, wheezes, or increased work of breathing. Despite the right pneumonectomy the breath sounds sound surprisingly equal without dramatic bronchovesicular quality on the right as expected.Asymmetry of thorax size. Heart: Normal rate and rhythm. Normal S1 and S2. No gallop, click, or rub. No murmur. Abdomen: Bowel sounds normal; abdomen soft and nontender. No masses, organomegaly or hernias noted.                                  Musculoskeletal/extremities: No deformity or scoliosis noted of  the thoracic or lumbar spine.   Minimal clubbing w/o cyanosis, edema, or significant extremity  deformity noted. Tone & strength  Normal. Joints normal . Nail health good. Able to lie down & sit up w/o help.  Vascular: Carotid, radial artery, dorsalis pedis and  posterior tibial pulses are full and equal. No bruits present. Neurologic: Alert and oriented x3.  Skin: Intact without suspicious lesions or rashes. Lymph: No cervical, axillary lymphadenopathy present. Psych: Mood and affect are normal. Normally interactive                                                                                        Assessment &  Plan:  #1 recurrent food dysphagia, mainly with pasta  #2 associated respiratory distress with at least 3 these episodes  #3 past history of posttraumatic pneumonectomy  Plan: The upper endoscopy is obviously appropriate. Chest x-ray 9/2 reviewed.Spirometry  indicated to assess present pulmonary status.

## 2013-07-20 ENCOUNTER — Ambulatory Visit (AMBULATORY_SURGERY_CENTER): Payer: Self-pay | Admitting: *Deleted

## 2013-07-20 VITALS — Ht 66.0 in | Wt 146.4 lb

## 2013-07-20 DIAGNOSIS — R1310 Dysphagia, unspecified: Secondary | ICD-10-CM

## 2013-07-20 NOTE — Progress Notes (Signed)
No allergies to eggs or soy. No problems with anesthesia.  

## 2013-08-08 ENCOUNTER — Ambulatory Visit (INDEPENDENT_AMBULATORY_CARE_PROVIDER_SITE_OTHER): Payer: Medicare Other

## 2013-08-08 DIAGNOSIS — Z23 Encounter for immunization: Secondary | ICD-10-CM

## 2013-08-09 ENCOUNTER — Ambulatory Visit (AMBULATORY_SURGERY_CENTER): Payer: Medicare Other | Admitting: Internal Medicine

## 2013-08-09 ENCOUNTER — Encounter: Payer: Self-pay | Admitting: Internal Medicine

## 2013-08-09 VITALS — BP 153/89 | HR 60 | Temp 98.7°F | Resp 20 | Ht 66.0 in | Wt 146.0 lb

## 2013-08-09 DIAGNOSIS — R933 Abnormal findings on diagnostic imaging of other parts of digestive tract: Secondary | ICD-10-CM

## 2013-08-09 DIAGNOSIS — R1310 Dysphagia, unspecified: Secondary | ICD-10-CM | POA: Diagnosis not present

## 2013-08-09 DIAGNOSIS — R1319 Other dysphagia: Secondary | ICD-10-CM | POA: Diagnosis not present

## 2013-08-09 DIAGNOSIS — E785 Hyperlipidemia, unspecified: Secondary | ICD-10-CM | POA: Diagnosis not present

## 2013-08-09 MED ORDER — SODIUM CHLORIDE 0.9 % IV SOLN: 500.0000 mL | INTRAVENOUS | Status: DC

## 2013-08-09 MED ORDER — OMEPRAZOLE 20 MG PO CPDR: 20.0000 mg | DELAYED_RELEASE_CAPSULE | Freq: Every day | ORAL | Status: DC

## 2013-08-09 NOTE — Patient Instructions (Signed)
Discharge instructions given with verbal understanding. Handout on a dilatation diet. Resume previous medications. YOU HAD AN ENDOSCOPIC PROCEDURE TODAY AT THE [facility]: Refer to the procedure report that was given to you for any specific questions about what was found during the examination.  If the procedure report does not answer your questions, please call your gastroenterologist to clarify.  If you requested that your care partner not be given the details of your procedure findings, then the procedure report has been included in a sealed envelope for you to review at your convenience later.  YOU SHOULD EXPECT: Some feelings of bloating in the abdomen. Passage of more gas than usual.  Walking can help get rid of the air that was put into your GI tract during the procedure and reduce the bloating. If you had a lower endoscopy (such as a colonoscopy or flexible sigmoidoscopy) you may notice spotting of blood in your stool or on the toilet paper. If you underwent a bowel prep for your procedure, then you may not have a normal bowel movement for a few days.  DIET: Your first meal following the procedure should be a light meal and then it is ok to progress to your normal diet.  A half-sandwich or bowl of soup is an example of a good first meal.  Heavy or fried foods are harder to digest and may make you feel nauseous or bloated.  Likewise meals heavy in dairy and vegetables can cause extra gas to form and this can also increase the bloating.  Drink plenty of fluids but you should avoid alcoholic beverages for 24 hours.  ACTIVITY: Your care partner should take you home directly after the procedure.  You should plan to take it easy, moving slowly for the rest of the day.  You can resume normal activity the day after the procedure however you should NOT DRIVE or use heavy machinery for 24 hours (because of the sedation medicines used during the test).    SYMPTOMS TO REPORT IMMEDIATELY: A  gastroenterologist can be reached at any hour.  During normal business hours, 8:30 AM to 5:00 PM Monday through Friday, call [phone].  After hours and on weekends, please call the GI answering service at [phone] who will take a message and have the physician on call contact you.   Following upper endoscopy (EGD)  Vomiting of blood or coffee ground material  New chest pain or pain under the shoulder blades  Painful or persistently difficult swallowing  New shortness of breath  Fever of 100F or higher  Black, tarry-looking stools  FOLLOW UP: If any biopsies were taken you will be contacted by phone or by letter within the next 1-3 weeks.  Call your gastroenterologist if you have not heard about the biopsies in 3 weeks.  Our staff will call the home number listed on your records the next business day following your procedure to check on you and address any questions or concerns that you may have at that time regarding the information given to you following your procedure. This is a courtesy call and so if there is no answer at the home number and we have not heard from you through the emergency physician on call, we will assume that you have returned to your regular daily activities without incident.  SIGNATURES/CONFIDENTIALITY: You and/or your care partner have signed paperwork which will be entered into your electronic medical record.  These signatures attest to the fact that that the information above   on your After Visit Summary has been reviewed and is understood.  Full responsibility of the confidentiality of this discharge information lies with you and/or your care-partner. 

## 2013-08-09 NOTE — Progress Notes (Signed)
Patient did not experience any of the following events: a burn prior to discharge; a fall within the facility; wrong site/side/patient/procedure/implant event; or a hospital transfer or hospital admission upon discharge from the facility. (G8907) Patient did not have preoperative order for IV antibiotic SSI prophylaxis. (G8918)  

## 2013-08-09 NOTE — Progress Notes (Signed)
Called to room to assist during endoscopic procedure.  Patient ID and intended procedure confirmed with present staff. Received instructions for my participation in the procedure from the performing physician.  

## 2013-08-09 NOTE — Op Note (Signed)
[facility] [address] [place], [address]   ENDOSCOPY PROCEDURE REPORT  PATIENT: Erika Walker, Erika Walker  MR#: [id number] BIRTHDATE: 02/03/1946 , 66  yrs. old GENDER: Female ENDOSCOPIST: Hart Carwin, MD REFERRED BY:  Marga Melnick, M.D. PROCEDURE DATE:  08/09/2013 PROCEDURE:  EGD, diagnostic and Savary dilation of esophagus ASA CLASS:     Class II INDICATIONS:  Dysphagia.   hx of crushing injury ti thorax, s/p right pneumonectomy, Ba esophagram shows esophageal deviation, r/o stricture. MEDICATIONS: MAC sedation, administered by CRNA and propofol (Diprivan) 150mg  IV TOPICAL ANESTHETIC: Cetacaine Spray  DESCRIPTION OF PROCEDURE: After the risks benefits and alternatives of the procedure were thoroughly explained, informed consent was obtained.  The LB [id number] endoscope was introduced through the mouth and advanced to the second portion of the duodenum. Without limitations.  The instrument was slowly withdrawn as the mucosa was fully examined.      [Esophagus: intubation of the cervical esophagus was difficult because of the eccentric 8 all upper esophageal sphincter. We were able to find the lumen and were able to advance the endoscope into the mid and distal esophagus which appeared normal there was no discrete stricture or bleeding from the proximal esophagus. There was a small 1 cm hiatal hernia which was normal reducible Stomach: Gastric mucosa was normal with normal appearing rugal pattern gastric antrum and pyloric outlet. Retroflexion of the endoscope revealed normal fundus and cardia. Duodenum duodenal bulb and descending duodenum was normal endoscope was then retracted into the stomach a guidewire was placed through the endoscope and over the guidewire Savary dilators passed using 12, 14, and 15 mm dilators which passed over the guidewire without difficulty with only mild resistance in proximal esophagus, there was no blood on the  dilator        The scope was then withdrawn from the patient and the procedure completed.  COMPLICATIONS: There were no complications. 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 No definite stricture. That is post passage of 12,14 and 15 mm Savary dilators. Normal rest of the esophagus stomach and duodenum Small 1 cm hiatal hernia RECOMMENDATIONS: Anti-reflux regimen to be follow trial of PPI prn episodes of  reflux/pressure  REPEAt  EGD : no  eSigned:  Hart Carwin, MD 08/09/2013 3:44 PM revise  PATIENT NAME:  Etter, Royall MR#: [id number]

## 2013-08-10 ENCOUNTER — Ambulatory Visit (INDEPENDENT_AMBULATORY_CARE_PROVIDER_SITE_OTHER): Payer: Medicare Other | Admitting: Internal Medicine

## 2013-08-10 ENCOUNTER — Telehealth: Payer: Self-pay | Admitting: *Deleted

## 2013-08-10 DIAGNOSIS — Z902 Acquired absence of lung [part of]: Secondary | ICD-10-CM

## 2013-08-10 DIAGNOSIS — Z9889 Other specified postprocedural states: Secondary | ICD-10-CM

## 2013-08-10 LAB — PULMONARY FUNCTION TEST

## 2013-08-10 NOTE — Telephone Encounter (Signed)
  Follow up Call-  Call back number 08/09/2013 07/07/2013  Post procedure Call Back phone  # [phone]  Permission to leave phone message Yes Yes     Patient questions:  Do you have a fever, pain , or abdominal swelling? no Pain Score  0 *  Have you tolerated food without any problems? yes  Have you been able to return to your normal activities? yes  Do you have any questions about your discharge instructions: Diet   no Medications  no Follow up visit  no  Do you have questions or concerns about your Care? no  Actions: * If pain score is 4 or above: No action needed, pain <4.

## 2013-08-10 NOTE — Progress Notes (Signed)
PFT done. Jennifer Castillo, CMA  

## 2013-08-20 ENCOUNTER — Encounter: Payer: Self-pay | Admitting: Internal Medicine

## 2013-08-20 ENCOUNTER — Other Ambulatory Visit: Payer: Self-pay | Admitting: Internal Medicine

## 2013-08-20 DIAGNOSIS — R0600 Dyspnea, unspecified: Secondary | ICD-10-CM | POA: Insufficient documentation

## 2013-08-20 MED ORDER — ALBUTEROL SULFATE HFA 108 (90 BASE) MCG/ACT IN AERS: INHALATION_SPRAY | RESPIRATORY_TRACT | Status: DC

## 2013-08-25 ENCOUNTER — Ambulatory Visit (INDEPENDENT_AMBULATORY_CARE_PROVIDER_SITE_OTHER): Payer: Medicare Other | Admitting: Internal Medicine

## 2013-08-25 ENCOUNTER — Encounter: Payer: Self-pay | Admitting: Internal Medicine

## 2013-08-25 VITALS — BP 180/77 | HR 71 | Temp 98.8°F | Resp 12 | Wt 149.4 lb

## 2013-08-25 DIAGNOSIS — Z902 Acquired absence of lung [part of]: Secondary | ICD-10-CM

## 2013-08-25 DIAGNOSIS — J310 Chronic rhinitis: Secondary | ICD-10-CM

## 2013-08-25 DIAGNOSIS — R0600 Dyspnea, unspecified: Secondary | ICD-10-CM

## 2013-08-25 DIAGNOSIS — Z9889 Other specified postprocedural states: Secondary | ICD-10-CM

## 2013-08-25 DIAGNOSIS — R0609 Other forms of dyspnea: Secondary | ICD-10-CM

## 2013-08-25 NOTE — Progress Notes (Signed)
  Subjective:    Patient ID: Erika Walker, female    DOB: 02-Jun-1946, 67 y.o.   MRN: [id number]  HPI The symptoms and signs related to her acute respiratory distress were reviewed and verified. The events in question are related to ingesting foods such as pasta. Her recent endoscopy showed some eccentric location of the cervical esophagus.      Review of Systems Blood pressure not monitored. No anti hypertemsive medication. Significant headaches, epistaxis, chest pain, palpitations,  claudication, paroxysmal nocturnal dyspnea, or edema absent.     Objective:   Physical Exam General appearance is one of good health and nourishment w/o distress.  Nares patent without inflammation or exudate   Oral exam: Dental hygiene is good; lips and gums are healthy appearing.There is no oropharyngeal erythema or exudate noted.   No upper airway compromise with hyperventilation  Heart:  Normal rate and regular rhythm. S1 and S2 normal without gallop, murmur, click, rub or other extra sounds  . Repeat BP 150/98.   Lungs:Chest clear to auscultation; no wheezes, rhonchi,rales ,or rubs present.No increased work of breathing. Decreased BS on R   Lymphatic: No lymphadenopathy is noted about the head, neck, axilla.   No cyanosis or edema. Slight clubbing               Assessment & Plan:  #1 paroxysmal respiratory distress which appears to be mechanical in nature. This is most likely related to airway compromise related to retained boli associated with edema in the context of the eccentric cervical esophagus. There may be small airways component which is responsive to bronchodilators.  #2 rhinitis  Plan: preventive measures discussed. Sample of bronchodilators provided

## 2013-08-25 NOTE — Patient Instructions (Addendum)
Minimal Blood Pressure Goal= AVERAGE < 140/90;  Ideal is an AVERAGE < 135/85. This AVERAGE should be calculated from @ least 5-7 BP readings taken @ different times of day on different days of week. You should not respond to isolated BP readings , but rather the AVERAGE for that week .Please bring your  blood pressure cuff to office visits to verify that it is reliable.It  can also be checked against the blood pressure device at the pharmacy. Finger or wrist cuffs are not dependable; an arm cuff is. Plain Mucinex (NOT D) for thick secretions ;force NON dairy fluids .   Nasal cleansing in the shower as discussed with lather of mild shampoo.After 10 seconds wash off lather while  exhaling through nostrils. Make sure that all residual soap is removed to prevent irritation.  Nasacort AQ  OTC 1 spray in each nostril twice a day as needed. Use the "crossover" technique into opposite nostril spraying toward opposite ear @ 45 degree angle, not straight up into nostril.  Use a Neti pot daily only  as needed for significant sinus congestion; going from open side to congested side . Plain Allegra (NOT D )  160 daily , Loratidine 10 mg , OR Zyrtec 10 mg @ bedtime  as needed for itchy eyes & sneezing.  Albuterol is a rescue inhaler which should be used  1-2 puffs every 4 hours as needed. It may  be used 15-30 minutes before exercise if that is also  a trigger for your asthma.

## 2013-08-25 NOTE — Progress Notes (Signed)
Pre-visit discussion using our clinic review tool, if applicable. No additional management support is needed unless otherwise documented below in the visit note.  

## 2013-09-08 ENCOUNTER — Encounter: Payer: Self-pay | Admitting: Internal Medicine

## 2014-03-08 ENCOUNTER — Encounter: Payer: Self-pay | Admitting: Women's Health

## 2014-03-08 ENCOUNTER — Other Ambulatory Visit (HOSPITAL_COMMUNITY)
Admission: RE | Admit: 2014-03-08 | Discharge: 2014-03-08 | Disposition: A | Discharge status: Home / Self Care | Payer: Medicare Other | Source: Ambulatory Visit | Attending: Gynecology | Admitting: Gynecology

## 2014-03-08 ENCOUNTER — Ambulatory Visit (INDEPENDENT_AMBULATORY_CARE_PROVIDER_SITE_OTHER): Payer: Medicare Other | Admitting: Women's Health

## 2014-03-08 VITALS — BP 120/76 | Ht 66.0 in | Wt 148.0 lb

## 2014-03-08 DIAGNOSIS — Z780 Asymptomatic menopausal state: Secondary | ICD-10-CM | POA: Diagnosis not present

## 2014-03-08 DIAGNOSIS — Z124 Encounter for screening for malignant neoplasm of cervix: Secondary | ICD-10-CM | POA: Insufficient documentation

## 2014-03-08 NOTE — Progress Notes (Signed)
Erika Walker 1946/10/03 [id number]    History:    Presents for breast and pelvic exam. Normal Pap and mammogram history. Colonoscopy 06/2013  moderate diverticuli/asymptomatic.  Vaccines current. Hypercholesteremia primary care manages labs. Normal bone density 2009.  Past medical history, past surgical history, family history and social history were all reviewed and documented in the EPIC chart. 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 3 children all doing well. Part-time [employer].  ROS:  A  12 point ROS was performed and pertinent positives and negatives are included.  Exam:  Filed Vitals:   03/08/14 1100  BP: 120/76    General appearance:  Normal Thyroid:  Symmetrical, normal in size, without palpable masses or nodularity. Respiratory  Auscultation:  Clear without wheezing or rhonchi Cardiovascular  Auscultation:  Regular rate, without rubs, murmurs or gallops  Edema/varicosities:  Not grossly evident Abdominal  Soft,nontender, without masses, guarding or rebound.  Liver/spleen:  No organomegaly noted  Hernia:  None appreciated  Skin  Inspection:  Grossly normal   Breasts: Examined lying and sitting.     Right: Without masses, retractions, discharge or axillary adenopathy.     Left: Without masses, retractions, discharge or axillary adenopathy. Gentitourinary   Inguinal/mons:  Normal without inguinal adenopathy  External genitalia:  Normal  BUS/Urethra/Skene's glands:  Normal  Vagina:  Normal  Cervix:  Normal  Uterus:   normal in size, shape and contour.  Midline and mobile  Adnexa/parametria:     Rt: Without masses or tenderness.   Lt: Without masses or tenderness.  Anus and perineum: Normal  Digital rectal exam: Normal sphincter tone without palpated masses or tenderness  Assessment/Plan:  68 y.o. MWF G3P3 for past and pelvic exam.  Postmenopausal/no HRT/no bleeding Hypercholesteremia primary care manages labs and meds  Plan: SBE's, continue annual mammogram,  calcium rich diet, vitamin D 2000 daily and regular exercise encouraged. Repeat DEXA, will schedule. Home safety, fall prevention discussed. UA, Pap, Pap normal 2012, new screening guidelines reviewed.    Note: This dictation was prepared with Dragon/digital dictation.  Any transcriptional errors that result are unintentional. Huel Cote [facility], 1:21 PM 03/08/2014

## 2014-03-08 NOTE — Patient Instructions (Signed)
Health Recommendations for Postmenopausal Women Respected and ongoing research has looked at the most common causes of death, disability, and poor quality of life in postmenopausal women. The causes include heart disease, diseases of blood vessels, diabetes, depression, cancer, and bone loss (osteoporosis). Many things can be done to help lower the chances of developing these and other common problems: CARDIOVASCULAR DISEASE Heart Disease: A heart attack is a medical emergency. Know the signs and symptoms of a heart attack. Below are things women can do to reduce their risk for heart disease.   Do not smoke. If you smoke, quit.  Aim for a healthy weight. Being overweight causes many preventable deaths. Eat a healthy and balanced diet and drink an adequate amount of liquids.  Get moving. Make a commitment to be more physically active. Aim for 30 minutes of activity on most, if not all days of the week.  Eat for heart health. Choose a diet that is low in saturated fat and cholesterol and eliminate trans fat. Include whole grains, vegetables, and fruits. Read and understand the labels on food containers before buying.  Know your numbers. Ask your caregiver to check your blood pressure, cholesterol (total, HDL, LDL, triglycerides) and blood glucose. Work with your caregiver on improving your entire clinical picture.  High blood pressure. Limit or stop your table salt intake (try salt substitute and food seasonings). Avoid salty foods and drinks. Read labels on food containers before buying. Eating well and exercising can help control high blood pressure. STROKE  Stroke is a medical emergency. Stroke may be the result of a blood clot in a blood vessel in the brain or by a brain hemorrhage (bleeding). Know the signs and symptoms of a stroke. To lower the risk of developing a stroke:  Avoid fatty foods.  Quit smoking.  Control your diabetes, blood pressure, and irregular heart rate. THROMBOPHLEBITIS  (BLOOD CLOT) OF THE LEG  Becoming overweight and leading a stationary lifestyle may also contribute to developing blood clots. Controlling your diet and exercising will help lower the risk of developing blood clots. CANCER SCREENING  Breast Cancer: Take steps to reduce your risk of breast cancer.  You should practice "breast self-awareness." This means understanding the normal appearance and feel of your breasts and should include breast self-examination. Any changes detected, no matter how small, should be reported to your caregiver.  After age 40, you should have a clinical breast exam (CBE) every year.  Starting at age 40, you should consider having a mammogram (breast X-ray) every year.  If you have a family history of breast cancer, talk to your caregiver about genetic screening.  If you are at high risk for breast cancer, talk to your caregiver about having an MRI and a mammogram every year.  Intestinal or Stomach Cancer: Tests to consider are a rectal exam, fecal occult blood, sigmoidoscopy, and colonoscopy. Women who are high risk may need to be screened at an earlier age and more often.  Cervical Cancer:  Beginning at age 30, you should have a Pap test every 3 years as long as the past 3 Pap tests have been normal.  If you have had past treatment for cervical cancer or a condition that could lead to cancer, you need Pap tests and screening for cancer for at least 20 years after your treatment.  If you had a hysterectomy for a problem that was not cancer or a condition that could lead to cancer, then you no longer need Pap tests.    If you are between ages 68 and 70, and you have had normal Pap tests going back 10 years, you no longer need Pap tests.  If Pap tests have been discontinued, risk factors (such as a new sexual partner) need to be reassessed to determine if screening should be resumed.  Some medical problems can increase the chance of getting cervical cancer. In these  cases, your caregiver may recommend more frequent screening and Pap tests.  Uterine Cancer: If you have vaginal bleeding after reaching menopause, you should notify your caregiver.  Ovarian cancer: Other than yearly pelvic exams, there are no reliable tests available to screen for ovarian cancer at this time except for yearly pelvic exams.  Lung Cancer: Yearly chest X-rays can detect lung cancer and should be done on high risk women, such as cigarette smokers and women with chronic lung disease (emphysema).  Skin Cancer: A complete body skin exam should be done at your yearly examination. Avoid overexposure to the sun and ultraviolet light lamps. Use a strong sun block cream when in the sun. All of these things are important in lowering the risk of skin cancer. MENOPAUSE Menopause Symptoms: Hormone therapy products are effective for treating symptoms associated with menopause:  Moderate to severe hot flashes.  Night sweats.  Mood swings.  Headaches.  Tiredness.  Loss of sex drive.  Insomnia.  Other symptoms. Hormone replacement carries certain risks, especially in older women. Women who use or are thinking about using estrogen or estrogen with progestin treatments should discuss that with their caregiver. Your caregiver will help you understand the benefits and risks. The ideal dose of hormone replacement therapy is not known. The Food and Drug Administration (FDA) has concluded that hormone therapy should be used only at the lowest doses and for the shortest amount of time to reach treatment goals.  OSTEOPOROSIS Protecting Against Bone Loss and Preventing Fracture: If you use hormone therapy for prevention of bone loss (osteoporosis), the risks for bone loss must outweigh the risk of the therapy. Ask your caregiver about other medications known to be safe and effective for preventing bone loss and fractures. To guard against bone loss or fractures, the following is recommended:  If  you are less than age 50, take 1000 mg of calcium and at least 600 mg of Vitamin D per day.  If you are greater than age 50 but less than age 70, take 1200 mg of calcium and at least 600 mg of Vitamin D per day.  If you are greater than age 70, take 1200 mg of calcium and at least 800 mg of Vitamin D per day. Smoking and excessive alcohol intake increases the risk of osteoporosis. Eat foods rich in calcium and vitamin D and do weight bearing exercises several times a week as your caregiver suggests. DIABETES Diabetes Melitus: If you have Type I or Type 2 diabetes, you should keep your blood sugar under control with diet, exercise and recommended medication. Avoid too many sweets, starchy and fatty foods. Being overweight can make control more difficult. COGNITION AND MEMORY Cognition and Memory: Menopausal hormone therapy is not recommended for the prevention of cognitive disorders such as Alzheimer's disease or memory loss.  DEPRESSION  Depression may occur at any age, but is common in elderly women. The reasons may be because of physical, medical, social (loneliness), or financial problems and needs. If you are experiencing depression because of medical problems and control of symptoms, talk to your caregiver about this. Physical activity and   exercise may help with mood and sleep. Community and volunteer involvement may help your sense of value and worth. If you have depression and you feel that the problem is getting worse or becoming severe, talk to your caregiver about treatment options that are best for you. ACCIDENTS  Accidents are common and can be serious in the elderly woman. Prepare your house to prevent accidents. Eliminate throw rugs, place hand bars in the bath, shower and toilet areas. Avoid wearing high heeled shoes or walking on wet, snowy, and icy areas. Limit or stop driving if you have vision or hearing problems, or you feel you are unsteady with you movements and  reflexes. HEPATITIS C Hepatitis C is a type of viral infection affecting the liver. It is spread mainly through contact with blood from an infected person. It can be treated, but if left untreated, it can lead to severe liver damage over years. Many people who are infected do not know that the virus is in their blood. If you are a "baby-boomer", it is recommended that you have one screening test for Hepatitis C. IMMUNIZATIONS  Several immunizations are important to consider having during your senior years, including:   Tetanus, diptheria, and pertussis booster shot.  Influenza every year before the flu season begins.  Pneumonia vaccine.  Shingles vaccine.  Others as indicated based on your specific needs. Talk to your caregiver about these. Document Released: 11/27/2005 Document Revised: 09/21/2012 Document Reviewed: 07/23/2008 [employer] Patient Information 2014 [employer].  

## 2014-05-28 DIAGNOSIS — H2510 Age-related nuclear cataract, unspecified eye: Secondary | ICD-10-CM | POA: Diagnosis not present

## 2014-05-28 DIAGNOSIS — H5200 Hypermetropia, unspecified eye: Secondary | ICD-10-CM | POA: Diagnosis not present

## 2014-05-28 DIAGNOSIS — H524 Presbyopia: Secondary | ICD-10-CM | POA: Diagnosis not present

## 2014-05-30 DIAGNOSIS — L819 Disorder of pigmentation, unspecified: Secondary | ICD-10-CM | POA: Diagnosis not present

## 2014-05-30 DIAGNOSIS — D1801 Hemangioma of skin and subcutaneous tissue: Secondary | ICD-10-CM | POA: Diagnosis not present

## 2014-05-30 DIAGNOSIS — L821 Other seborrheic keratosis: Secondary | ICD-10-CM | POA: Diagnosis not present

## 2014-05-30 DIAGNOSIS — L570 Actinic keratosis: Secondary | ICD-10-CM | POA: Diagnosis not present

## 2014-07-17 ENCOUNTER — Ambulatory Visit (INDEPENDENT_AMBULATORY_CARE_PROVIDER_SITE_OTHER): Payer: Medicare Other | Admitting: *Deleted

## 2014-07-17 DIAGNOSIS — Z23 Encounter for immunization: Secondary | ICD-10-CM

## 2014-08-20 ENCOUNTER — Encounter: Payer: Self-pay | Admitting: Women's Health

## 2015-03-20 DEATH — deceased

## 2015-03-21 DIAGNOSIS — H01001 Unspecified blepharitis right upper eyelid: Secondary | ICD-10-CM | POA: Diagnosis not present

## 2015-03-21 DIAGNOSIS — H01005 Unspecified blepharitis left lower eyelid: Secondary | ICD-10-CM | POA: Diagnosis not present

## 2015-03-21 DIAGNOSIS — H01004 Unspecified blepharitis left upper eyelid: Secondary | ICD-10-CM | POA: Diagnosis not present

## 2015-03-21 DIAGNOSIS — H01002 Unspecified blepharitis right lower eyelid: Secondary | ICD-10-CM | POA: Diagnosis not present

## 2015-05-30 DIAGNOSIS — H10413 Chronic giant papillary conjunctivitis, bilateral: Secondary | ICD-10-CM | POA: Diagnosis not present

## 2015-05-30 DIAGNOSIS — H04123 Dry eye syndrome of bilateral lacrimal glands: Secondary | ICD-10-CM | POA: Diagnosis not present

## 2015-05-30 DIAGNOSIS — H524 Presbyopia: Secondary | ICD-10-CM | POA: Diagnosis not present

## 2015-05-30 DIAGNOSIS — H5203 Hypermetropia, bilateral: Secondary | ICD-10-CM | POA: Diagnosis not present

## 2015-06-10 DIAGNOSIS — M722 Plantar fascial fibromatosis: Secondary | ICD-10-CM | POA: Diagnosis not present

## 2015-07-24 DIAGNOSIS — D2262 Melanocytic nevi of left upper limb, including shoulder: Secondary | ICD-10-CM | POA: Diagnosis not present

## 2015-07-24 DIAGNOSIS — D1801 Hemangioma of skin and subcutaneous tissue: Secondary | ICD-10-CM | POA: Diagnosis not present

## 2015-07-24 DIAGNOSIS — L570 Actinic keratosis: Secondary | ICD-10-CM | POA: Diagnosis not present

## 2015-07-24 DIAGNOSIS — D2261 Melanocytic nevi of right upper limb, including shoulder: Secondary | ICD-10-CM | POA: Diagnosis not present

## 2015-07-24 DIAGNOSIS — L814 Other melanin hyperpigmentation: Secondary | ICD-10-CM | POA: Diagnosis not present

## 2015-07-24 DIAGNOSIS — L821 Other seborrheic keratosis: Secondary | ICD-10-CM | POA: Diagnosis not present

## 2015-07-24 DIAGNOSIS — L820 Inflamed seborrheic keratosis: Secondary | ICD-10-CM | POA: Diagnosis not present

## 2015-07-29 ENCOUNTER — Telehealth: Payer: Self-pay | Admitting: Internal Medicine

## 2015-07-29 NOTE — Telephone Encounter (Signed)
Left detail massage for pt.

## 2015-07-29 NOTE — Telephone Encounter (Signed)
Yes; please set up Nurse visit

## 2015-07-29 NOTE — Telephone Encounter (Signed)
Pt request flu injection. Last ov was 08/2013. Please advise? [place] for pt to get flu injection?

## 2015-08-02 ENCOUNTER — Ambulatory Visit (INDEPENDENT_AMBULATORY_CARE_PROVIDER_SITE_OTHER): Payer: Medicare Other

## 2015-08-02 DIAGNOSIS — Z23 Encounter for immunization: Secondary | ICD-10-CM

## 2015-08-12 DIAGNOSIS — M25532 Pain in left wrist: Secondary | ICD-10-CM | POA: Diagnosis not present

## 2015-09-25 ENCOUNTER — Encounter: Payer: Self-pay | Admitting: Internal Medicine

## 2015-09-25 ENCOUNTER — Ambulatory Visit (INDEPENDENT_AMBULATORY_CARE_PROVIDER_SITE_OTHER): Payer: Medicare Other | Admitting: Internal Medicine

## 2015-09-25 VITALS — BP 140/90 | HR 71 | Temp 98.7°F | Resp 16 | Ht 66.0 in | Wt 154.0 lb

## 2015-09-25 DIAGNOSIS — R030 Elevated blood-pressure reading, without diagnosis of hypertension: Secondary | ICD-10-CM | POA: Diagnosis not present

## 2015-09-25 DIAGNOSIS — H8111 Benign paroxysmal vertigo, right ear: Secondary | ICD-10-CM | POA: Diagnosis not present

## 2015-09-25 MED ORDER — HYDROCHLOROTHIAZIDE 12.5 MG PO CAPS
12.5000 mg | ORAL_CAPSULE | Freq: Every day | ORAL | Status: DC
Start: 2015-09-25 — End: 2015-10-31
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
## 2015-09-25 NOTE — Patient Instructions (Signed)
Go to Web M.D. for information on benign positional vertigo (BPV) . Physical therapy exercises can treat that.  Minimal Blood Pressure Goal= AVERAGE < 140/90;  Ideal is an AVERAGE < 135/85. This AVERAGE should be calculated from @ least 5-7 BP readings taken @ different times of day on different days of week. You should not respond to isolated BP readings , but rather the AVERAGE for that week .Please bring your  blood pressure cuff to office visits to verify that it is reliable.It  can also be checked against the blood pressure device at the pharmacy. Finger or wrist cuffs are not dependable; an arm cuff is.  Fill the  prescription for the BP medication if BP NOT @ goal based on  7 to 14 day average or if BPV symptoms persist.

## 2015-09-25 NOTE — Progress Notes (Signed)
Pre visit review using our clinic review tool, if applicable. No additional management support is needed unless otherwise documented below in the visit note. 

## 2015-10-03 ENCOUNTER — Telehealth: Payer: Self-pay | Admitting: Internal Medicine

## 2015-10-03 ENCOUNTER — Ambulatory Visit (INDEPENDENT_AMBULATORY_CARE_PROVIDER_SITE_OTHER): Payer: Medicare Other

## 2015-10-03 DIAGNOSIS — Z23 Encounter for immunization: Secondary | ICD-10-CM | POA: Diagnosis not present

## 2015-10-03 DIAGNOSIS — Z0000 Encounter for general adult medical examination without abnormal findings: Secondary | ICD-10-CM

## 2015-10-03 NOTE — Telephone Encounter (Signed)
Order entered, pt advised via VM

## 2015-10-03 NOTE — Telephone Encounter (Signed)
Needs hep C screening order entered

## 2015-10-07 ENCOUNTER — Other Ambulatory Visit: Payer: Medicare Other

## 2015-10-07 DIAGNOSIS — Z0000 Encounter for general adult medical examination without abnormal findings: Secondary | ICD-10-CM

## 2015-10-08 LAB — HEPATITIS C ANTIBODY: HCV AB: NEGATIVE

## 2015-10-23 DIAGNOSIS — R0989 Other specified symptoms and signs involving the circulatory and respiratory systems: Secondary | ICD-10-CM | POA: Diagnosis not present

## 2015-10-23 DIAGNOSIS — R030 Elevated blood-pressure reading, without diagnosis of hypertension: Secondary | ICD-10-CM | POA: Diagnosis not present

## 2015-10-23 DIAGNOSIS — Z9089 Acquired absence of other organs: Secondary | ICD-10-CM | POA: Diagnosis not present

## 2015-10-23 DIAGNOSIS — R918 Other nonspecific abnormal finding of lung field: Secondary | ICD-10-CM | POA: Diagnosis not present

## 2015-10-23 DIAGNOSIS — R092 Respiratory arrest: Secondary | ICD-10-CM | POA: Diagnosis not present

## 2015-10-31 ENCOUNTER — Encounter: Payer: Self-pay | Admitting: Internal Medicine

## 2015-10-31 ENCOUNTER — Ambulatory Visit (INDEPENDENT_AMBULATORY_CARE_PROVIDER_SITE_OTHER): Payer: Medicare Other | Admitting: Internal Medicine

## 2015-10-31 VITALS — BP 118/70 | HR 80 | Temp 98.0°F | Resp 16 | Wt 155.0 lb

## 2015-10-31 DIAGNOSIS — R1310 Dysphagia, unspecified: Secondary | ICD-10-CM | POA: Diagnosis not present

## 2015-10-31 DIAGNOSIS — R1319 Other dysphagia: Secondary | ICD-10-CM

## 2015-10-31 NOTE — Assessment & Plan Note (Signed)
H/o pneumonectomy on right - likely caused a esophageal deviation that has caused some dysphagia in past - EGD 08/09/13 showed deviation, no stricture, but there was an area that was dilated.  Has been fine since then, but over the last 6 months having symptoms - may need dilation again No GERD symptoms Refer to GI urgently given severity of choking episode last week. She will call if she has symptoms again prior to seeing GI She is taking all precautions with eating
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
## 2015-10-31 NOTE — Progress Notes (Signed)
Pre visit review using our clinic review tool, if applicable. No additional management support is needed unless otherwise documented below in the visit note. 

## 2015-10-31 NOTE — Patient Instructions (Signed)
A referral for GI was ordered.  Continue to take precautions when you eat an dif your symptoms worsen please call me immediately.   Schedule a physical at your convenience.

## 2015-11-01 ENCOUNTER — Telehealth: Payer: Self-pay | Admitting: Gastroenterology

## 2015-11-01 NOTE — Telephone Encounter (Signed)
Pt has been scheduled for 11/04/15 3:45 pm

## 2015-11-04 ENCOUNTER — Ambulatory Visit (INDEPENDENT_AMBULATORY_CARE_PROVIDER_SITE_OTHER): Payer: Medicare Other | Admitting: Gastroenterology

## 2015-11-04 ENCOUNTER — Encounter: Payer: Self-pay | Admitting: Gastroenterology

## 2015-11-04 VITALS — BP 110/68 | HR 80 | Ht 66.0 in | Wt 152.6 lb

## 2015-11-04 DIAGNOSIS — R1310 Dysphagia, unspecified: Secondary | ICD-10-CM

## 2015-11-04 DIAGNOSIS — T17308D Unspecified foreign body in larynx causing other injury, subsequent encounter: Secondary | ICD-10-CM

## 2015-11-04 NOTE — Patient Instructions (Signed)
You will be set up for an upper endoscopy with savary dilation, [facility].
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
## 2015-11-29 ENCOUNTER — Other Ambulatory Visit: Payer: Self-pay

## 2015-11-29 DIAGNOSIS — Z1231 Encounter for screening mammogram for malignant neoplasm of breast: Secondary | ICD-10-CM

## 2015-12-13 ENCOUNTER — Ambulatory Visit
Admission: RE | Admit: 2015-12-13 | Discharge: 2015-12-13 | Disposition: A | Discharge status: Home / Self Care | Payer: Medicare Other | Source: Ambulatory Visit

## 2015-12-13 DIAGNOSIS — Z1231 Encounter for screening mammogram for malignant neoplasm of breast: Secondary | ICD-10-CM | POA: Diagnosis not present

## 2015-12-19 DIAGNOSIS — C44329 Squamous cell carcinoma of skin of other parts of face: Secondary | ICD-10-CM | POA: Diagnosis not present

## 2015-12-19 DIAGNOSIS — L820 Inflamed seborrheic keratosis: Secondary | ICD-10-CM | POA: Diagnosis not present

## 2015-12-20 ENCOUNTER — Encounter: Payer: Self-pay | Admitting: Gastroenterology

## 2015-12-20 ENCOUNTER — Ambulatory Visit (AMBULATORY_SURGERY_CENTER): Payer: Medicare Other | Admitting: Gastroenterology

## 2015-12-20 VITALS — BP 138/81 | HR 62 | Temp 97.5°F | Resp 16 | Ht 66.0 in | Wt 152.0 lb

## 2015-12-20 DIAGNOSIS — T17308D Unspecified foreign body in larynx causing other injury, subsequent encounter: Secondary | ICD-10-CM

## 2015-12-20 DIAGNOSIS — R1310 Dysphagia, unspecified: Secondary | ICD-10-CM | POA: Diagnosis not present

## 2015-12-20 DIAGNOSIS — R1319 Other dysphagia: Secondary | ICD-10-CM | POA: Diagnosis not present

## 2015-12-20 MED ORDER — SODIUM CHLORIDE 0.9 % IV SOLN: 500.0000 mL | INTRAVENOUS | Status: DC

## 2015-12-20 NOTE — Op Note (Signed)
[place]  [address] [place], [id number]   ENDOSCOPY PROCEDURE REPORT  PATIENT: Erika, Walker  MR#: [id number] BIRTHDATE: 02/17/1946 , 87  yrs. old GENDER: female ENDOSCOPIST: Milus Banister, MD REFERRED BY: PROCEDURE DATE:  12/20/2015 PROCEDURE:  EGD, diagnostic ASA CLASS:     Class II INDICATIONS:  intermittent choking, s/p remote pneumonectomy, s/p savary dilation of UES 2014 with Dr.  Olevia Perches with good relief of her symptoms. MEDICATIONS: Monitored anesthesia care and Propofol 100 mg IV TOPICAL ANESTHETIC: none  DESCRIPTION OF PROCEDURE: After the risks benefits and alternatives of the procedure were thoroughly explained, informed consent was obtained.  The LB [id number] endoscope was introduced through the mouth and advanced to the second portion of the duodenum , Without limitations.  The instrument was slowly withdrawn as the mucosa was fully examined.  Eccentric anatomy at vocal cords.  The esophagus was a bit tortuous but there were no narrowings that I could detect (EUS, esophagus, LES) and given completely normal esophagus I did not proceed with dilation.  There was a 2cm hiatal hernia.  The examination was otherwise normal.  Retroflexed views revealed no abnormalities. The scope was then withdrawn from the patient and the procedure completed. COMPLICATIONS: There were no immediate complications.  ENDOSCOPIC IMPRESSION: Eccentric anatomy at vocal cords.  The esophagus was a bit tortuous but there were no narrowings that I could detect (EUS, esophagus, LES) and given completely normal esophagus I did not proceed with dilation. There was a 2cm hiatal hernia.  The examination was otherwise normal  RECOMMENDATIONS: You choking is not due to your esophagus but rather from altered upper airway, vocal cord anatomy from remote pneumonectomy.   eSigned:  Milus Banister, MD 12/20/2015 2:52 PM    CC:

## 2015-12-20 NOTE — Progress Notes (Signed)
Report to PACU, RN, vss, BBS= Clear.  

## 2015-12-20 NOTE — Patient Instructions (Signed)
Discharge instructions given. Normal  Exam. Resume previous medications. YOU HAD AN ENDOSCOPIC PROCEDURE TODAY AT [facility]:   Refer to the procedure report that was given to you for any specific questions about what was found during the examination.  If the procedure report does not answer your questions, please call your gastroenterologist to clarify.  If you requested that your care partner not be given the details of your procedure findings, then the procedure report has been included in a sealed envelope for you to review at your convenience later.  YOU SHOULD EXPECT: Some feelings of bloating in the abdomen. Passage of more gas than usual.  Walking can help get rid of the air that was put into your GI tract during the procedure and reduce the bloating. If you had a lower endoscopy (such as a colonoscopy or flexible sigmoidoscopy) you may notice spotting of blood in your stool or on the toilet paper. If you underwent a bowel prep for your procedure, you may not have a normal bowel movement for a few days.  Please Note:  You might notice some irritation and congestion in your nose or some drainage.  This is from the oxygen used during your procedure.  There is no need for concern and it should clear up in a day or so.  SYMPTOMS TO REPORT IMMEDIATELY:    Following upper endoscopy (EGD)  Vomiting of blood or coffee ground material  New chest pain or pain under the shoulder blades  Painful or persistently difficult swallowing  New shortness of breath  Fever of 100F or higher  Black, tarry-looking stools  For urgent or emergent issues, a gastroenterologist can be reached at any hour by calling [phone].   DIET: Your first meal following the procedure should be a small meal and then it is ok to progress to your normal diet. Heavy or fried foods are harder to digest and may make you feel nauseous or bloated.  Likewise, meals heavy in dairy and vegetables can increase  bloating.  Drink plenty of fluids but you should avoid alcoholic beverages for 24 hours.  ACTIVITY:  You should plan to take it easy for the rest of today and you should NOT DRIVE or use heavy machinery until tomorrow (because of the sedation medicines used during the test).    FOLLOW UP: Our staff will call the number listed on your records the next business day following your procedure to check on you and address any questions or concerns that you may have regarding the information given to you following your procedure. If we do not reach you, we will leave a message.  However, if you are feeling well and you are not experiencing any problems, there is no need to return our call.  We will assume that you have returned to your regular daily activities without incident.  If any biopsies were taken you will be contacted by phone or by letter within the next 1-3 weeks.  Please call us at [phone] if you have not heard about the biopsies in 3 weeks.    SIGNATURES/CONFIDENTIALITY: You and/or your care partner have signed paperwork which will be entered into your electronic medical record.  These signatures attest to the fact that that the information above on your After Visit Summary has been reviewed and is understood.  Full responsibility of the confidentiality of this discharge information lies with you and/or your care-partner.

## 2015-12-23 ENCOUNTER — Telehealth: Payer: Self-pay | Admitting: *Deleted

## 2015-12-23 NOTE — Telephone Encounter (Signed)
  Follow up Call-  Call back number 12/20/2015 08/09/2013 07/07/2013  Post procedure Call Back phone  # [phone]  Permission to leave phone message Yes Yes Yes     Patient questions:  Do you have a fever, pain , or abdominal swelling? No. Pain Score  0 *  Have you tolerated food without any problems? Yes.    Have you been able to return to your normal activities? Yes.    Do you have any questions about your discharge instructions: Diet   No. Medications  No. Follow up visit  No.  Do you have questions or concerns about your Care? No.  Actions: * If pain score is 4 or above: No action needed, pain <4.

## 2015-12-24 ENCOUNTER — Encounter: Payer: Medicare Other | Admitting: Gastroenterology

## 2016-01-08 ENCOUNTER — Encounter: Payer: Medicare Other | Admitting: Gastroenterology

## 2016-01-28 DIAGNOSIS — C4401 Basal cell carcinoma of skin of lip: Secondary | ICD-10-CM | POA: Diagnosis not present

## 2016-01-28 DIAGNOSIS — Z85828 Personal history of other malignant neoplasm of skin: Secondary | ICD-10-CM | POA: Diagnosis not present

## 2016-06-01 DIAGNOSIS — H25813 Combined forms of age-related cataract, bilateral: Secondary | ICD-10-CM | POA: Diagnosis not present

## 2016-06-01 DIAGNOSIS — H524 Presbyopia: Secondary | ICD-10-CM | POA: Diagnosis not present

## 2016-06-01 DIAGNOSIS — H5203 Hypermetropia, bilateral: Secondary | ICD-10-CM | POA: Diagnosis not present

## 2016-07-07 DIAGNOSIS — D1801 Hemangioma of skin and subcutaneous tissue: Secondary | ICD-10-CM | POA: Diagnosis not present

## 2016-07-07 DIAGNOSIS — Z85828 Personal history of other malignant neoplasm of skin: Secondary | ICD-10-CM | POA: Diagnosis not present

## 2016-07-07 DIAGNOSIS — D1800 Hemangioma unspecified site: Secondary | ICD-10-CM | POA: Diagnosis not present

## 2016-07-07 DIAGNOSIS — D485 Neoplasm of uncertain behavior of skin: Secondary | ICD-10-CM | POA: Diagnosis not present

## 2016-07-21 ENCOUNTER — Ambulatory Visit (INDEPENDENT_AMBULATORY_CARE_PROVIDER_SITE_OTHER): Payer: Medicare Other

## 2016-07-21 DIAGNOSIS — Z23 Encounter for immunization: Secondary | ICD-10-CM | POA: Diagnosis not present

## 2016-07-29 DIAGNOSIS — D1801 Hemangioma of skin and subcutaneous tissue: Secondary | ICD-10-CM | POA: Diagnosis not present

## 2016-07-29 DIAGNOSIS — Z85828 Personal history of other malignant neoplasm of skin: Secondary | ICD-10-CM | POA: Diagnosis not present

## 2016-07-29 DIAGNOSIS — D485 Neoplasm of uncertain behavior of skin: Secondary | ICD-10-CM | POA: Diagnosis not present

## 2016-07-29 DIAGNOSIS — L812 Freckles: Secondary | ICD-10-CM | POA: Diagnosis not present

## 2016-07-29 DIAGNOSIS — L720 Epidermal cyst: Secondary | ICD-10-CM | POA: Diagnosis not present

## 2016-07-29 DIAGNOSIS — L821 Other seborrheic keratosis: Secondary | ICD-10-CM | POA: Diagnosis not present

## 2016-07-29 DIAGNOSIS — L570 Actinic keratosis: Secondary | ICD-10-CM | POA: Diagnosis not present

## 2016-08-21 ENCOUNTER — Ambulatory Visit: Payer: Medicare Other | Admitting: Internal Medicine

## 2016-09-24 ENCOUNTER — Inpatient Hospital Stay (HOSPITAL_COMMUNITY)
Admission: EM | Admit: 2016-09-24 | Discharge: 2016-09-28 | DRG: 853 | Disposition: A | Discharge status: Home / Self Care | Payer: Medicare Other | Attending: Internal Medicine | Admitting: Internal Medicine

## 2016-09-24 ENCOUNTER — Inpatient Hospital Stay (HOSPITAL_COMMUNITY): Payer: Medicare Other

## 2016-09-24 ENCOUNTER — Encounter (HOSPITAL_COMMUNITY): Payer: Self-pay | Admitting: Emergency Medicine

## 2016-09-24 ENCOUNTER — Emergency Department (HOSPITAL_COMMUNITY): Payer: Medicare Other

## 2016-09-24 DIAGNOSIS — A419 Sepsis, unspecified organism: Principal | ICD-10-CM | POA: Diagnosis present

## 2016-09-24 DIAGNOSIS — N170 Acute kidney failure with tubular necrosis: Secondary | ICD-10-CM | POA: Diagnosis not present

## 2016-09-24 DIAGNOSIS — E785 Hyperlipidemia, unspecified: Secondary | ICD-10-CM | POA: Diagnosis present

## 2016-09-24 DIAGNOSIS — J690 Pneumonitis due to inhalation of food and vomit: Secondary | ICD-10-CM | POA: Diagnosis not present

## 2016-09-24 DIAGNOSIS — J9601 Acute respiratory failure with hypoxia: Secondary | ICD-10-CM | POA: Diagnosis not present

## 2016-09-24 DIAGNOSIS — T380X5A Adverse effect of glucocorticoids and synthetic analogues, initial encounter: Secondary | ICD-10-CM | POA: Diagnosis present

## 2016-09-24 DIAGNOSIS — T17820A Food in other parts of respiratory tract causing asphyxiation, initial encounter: Secondary | ICD-10-CM | POA: Diagnosis not present

## 2016-09-24 DIAGNOSIS — K228 Other specified diseases of esophagus: Secondary | ICD-10-CM | POA: Diagnosis present

## 2016-09-24 DIAGNOSIS — J9600 Acute respiratory failure, unspecified whether with hypoxia or hypercapnia: Secondary | ICD-10-CM | POA: Diagnosis not present

## 2016-09-24 DIAGNOSIS — I959 Hypotension, unspecified: Secondary | ICD-10-CM | POA: Diagnosis not present

## 2016-09-24 DIAGNOSIS — J9690 Respiratory failure, unspecified, unspecified whether with hypoxia or hypercapnia: Secondary | ICD-10-CM | POA: Diagnosis not present

## 2016-09-24 DIAGNOSIS — Z833 Family history of diabetes mellitus: Secondary | ICD-10-CM

## 2016-09-24 DIAGNOSIS — R05 Cough: Secondary | ICD-10-CM | POA: Diagnosis not present

## 2016-09-24 DIAGNOSIS — E872 Acidosis: Secondary | ICD-10-CM | POA: Diagnosis present

## 2016-09-24 DIAGNOSIS — R1310 Dysphagia, unspecified: Secondary | ICD-10-CM | POA: Diagnosis present

## 2016-09-24 DIAGNOSIS — J189 Pneumonia, unspecified organism: Secondary | ICD-10-CM | POA: Diagnosis not present

## 2016-09-24 DIAGNOSIS — R0602 Shortness of breath: Secondary | ICD-10-CM | POA: Diagnosis not present

## 2016-09-24 DIAGNOSIS — E876 Hypokalemia: Secondary | ICD-10-CM | POA: Diagnosis present

## 2016-09-24 DIAGNOSIS — Z800 Family history of malignant neoplasm of digestive organs: Secondary | ICD-10-CM

## 2016-09-24 DIAGNOSIS — R739 Hyperglycemia, unspecified: Secondary | ICD-10-CM | POA: Diagnosis present

## 2016-09-24 DIAGNOSIS — K222 Esophageal obstruction: Secondary | ICD-10-CM | POA: Diagnosis not present

## 2016-09-24 DIAGNOSIS — T4275XA Adverse effect of unspecified antiepileptic and sedative-hypnotic drugs, initial encounter: Secondary | ICD-10-CM | POA: Diagnosis present

## 2016-09-24 DIAGNOSIS — K2100 Gastro-esophageal reflux disease with esophagitis, without bleeding: Secondary | ICD-10-CM

## 2016-09-24 DIAGNOSIS — T18128A Food in esophagus causing other injury, initial encounter: Secondary | ICD-10-CM | POA: Diagnosis not present

## 2016-09-24 DIAGNOSIS — R061 Stridor: Secondary | ICD-10-CM

## 2016-09-24 DIAGNOSIS — Z902 Acquired absence of lung [part of]: Secondary | ICD-10-CM | POA: Diagnosis not present

## 2016-09-24 DIAGNOSIS — K449 Diaphragmatic hernia without obstruction or gangrene: Secondary | ICD-10-CM | POA: Diagnosis present

## 2016-09-24 DIAGNOSIS — R404 Transient alteration of awareness: Secondary | ICD-10-CM | POA: Diagnosis not present

## 2016-09-24 DIAGNOSIS — Z4682 Encounter for fitting and adjustment of non-vascular catheter: Secondary | ICD-10-CM | POA: Diagnosis not present

## 2016-09-24 DIAGNOSIS — R0989 Other specified symptoms and signs involving the circulatory and respiratory systems: Secondary | ICD-10-CM | POA: Diagnosis not present

## 2016-09-24 DIAGNOSIS — Y92238 Other place in hospital as the place of occurrence of the external cause: Secondary | ICD-10-CM | POA: Diagnosis present

## 2016-09-24 DIAGNOSIS — R1314 Dysphagia, pharyngoesophageal phase: Secondary | ICD-10-CM | POA: Diagnosis not present

## 2016-09-24 DIAGNOSIS — R0600 Dyspnea, unspecified: Secondary | ICD-10-CM | POA: Diagnosis not present

## 2016-09-24 DIAGNOSIS — R069 Unspecified abnormalities of breathing: Secondary | ICD-10-CM | POA: Diagnosis not present

## 2016-09-24 LAB — CBC
HCT: 43.9 % (ref 36.0–46.0)
HEMATOCRIT: 40.7 % (ref 36.0–46.0)
HEMOGLOBIN: 13.7 g/dL (ref 12.0–15.0)
Hemoglobin: 14.5 g/dL (ref 12.0–15.0)
MCH: 31.6 pg (ref 26.0–34.0)
MCH: 31.6 pg (ref 26.0–34.0)
MCHC: 33.7 g/dL (ref 30.0–36.0)
MCHC: 33 g/dL (ref 30.0–36.0)
MCV: 93.8 fL (ref 78.0–100.0)
MCV: 95.6 fL (ref 78.0–100.0)
PLATELETS: 213 10*3/uL (ref 150–400)
Platelets: 251 10*3/uL (ref 150–400)
RBC: 4.34 MIL/uL (ref 3.87–5.11)
RBC: 4.59 MIL/uL (ref 3.87–5.11)
RDW: 12.3 % (ref 11.5–15.5)
RDW: 12.8 % (ref 11.5–15.5)
WBC: 18.9 10*3/uL — AB (ref 4.0–10.5)
WBC: 14.3 10*3/uL — ABNORMAL HIGH (ref 4.0–10.5)

## 2016-09-24 LAB — I-STAT ARTERIAL BLOOD GAS, ED
ACID-BASE DEFICIT: 5 mmol/L — AB (ref 0.0–2.0)
Acid-base deficit: 7 mmol/L — ABNORMAL HIGH (ref 0.0–2.0)
Bicarbonate: 22.8 mmol/L (ref 20.0–28.0)
Bicarbonate: 18.9 mmol/L — ABNORMAL LOW (ref 20.0–28.0)
O2 SAT: 99 %
O2 SAT: 97 %
PCO2 ART: 52.3 mmHg — AB (ref 32.0–48.0)
PCO2 ART: 36.6 mmHg (ref 32.0–48.0)
PH ART: 7.244 — AB (ref 7.350–7.450)
PO2 ART: 91 mmHg (ref 83.0–108.0)
Patient temperature: 97.6
Patient temperature: 97.6
TCO2: 24 mmol/L (ref 0–100)
TCO2: 20 mmol/L (ref 0–100)
pH, Arterial: 7.318 — ABNORMAL LOW (ref 7.350–7.450)
pO2, Arterial: 165 mmHg — ABNORMAL HIGH (ref 83.0–108.0)

## 2016-09-24 LAB — BASIC METABOLIC PANEL
Anion gap: 10 (ref 5–15)
BUN: 19 mg/dL (ref 6–20)
CHLORIDE: 106 mmol/L (ref 101–111)
CO2: 21 mmol/L — AB (ref 22–32)
CREATININE: 1.18 mg/dL — AB (ref 0.44–1.00)
Calcium: 7.8 mg/dL — ABNORMAL LOW (ref 8.9–10.3)
GFR calc non Af Amer: 46 mL/min — ABNORMAL LOW (ref >60)
GFR, EST AFRICAN AMERICAN: 53 mL/min — AB (ref >60)
Glucose, Bld: 247 mg/dL — ABNORMAL HIGH (ref 65–99)
POTASSIUM: 3.3 mmol/L — AB (ref 3.5–5.1)
SODIUM: 137 mmol/L (ref 135–145)

## 2016-09-24 LAB — URINALYSIS, ROUTINE W REFLEX MICROSCOPIC
BILIRUBIN URINE: NEGATIVE
Bacteria, UA: NONE SEEN
GLUCOSE, UA: 150 mg/dL — AB
Ketones, ur: NEGATIVE mg/dL
LEUKOCYTES UA: NEGATIVE
NITRITE: NEGATIVE
PH: 5 (ref 5.0–8.0)
Protein, ur: NEGATIVE mg/dL
SPECIFIC GRAVITY, URINE: 1.014 (ref 1.005–1.030)

## 2016-09-24 LAB — LACTIC ACID, PLASMA
LACTIC ACID, VENOUS: 4.2 mmol/L — AB (ref 0.5–1.9)
LACTIC ACID, VENOUS: 6 mmol/L — AB (ref 0.5–1.9)
Lactic Acid, Venous: 5 mmol/L (ref 0.5–1.9)

## 2016-09-24 LAB — GLUCOSE, CAPILLARY
GLUCOSE-CAPILLARY: 146 mg/dL — AB (ref 65–99)
Glucose-Capillary: 180 mg/dL — ABNORMAL HIGH (ref 65–99)

## 2016-09-24 LAB — CORTISOL: CORTISOL PLASMA: 19.3 ug/dL

## 2016-09-24 LAB — CREATININE, SERUM
CREATININE: 1.17 mg/dL — AB (ref 0.44–1.00)
GFR calc Af Amer: 54 mL/min — ABNORMAL LOW (ref >60)
GFR, EST NON AFRICAN AMERICAN: 46 mL/min — AB (ref >60)

## 2016-09-24 LAB — PROCALCITONIN: Procalcitonin: 0.78 ng/mL

## 2016-09-24 LAB — MRSA PCR SCREENING: MRSA BY PCR: NEGATIVE

## 2016-09-24 MED ORDER — MIDAZOLAM HCL 2 MG/2ML IJ SOLN
INTRAMUSCULAR | Status: AC
  Administered 2016-09-24: 2 mg via INTRAVENOUS
  Filled 2016-09-24: qty 4

## 2016-09-24 MED ORDER — CHLORHEXIDINE GLUCONATE 0.12% ORAL RINSE (MEDLINE KIT)
15.0000 mL | Freq: Two times a day (BID) | OROMUCOSAL | Status: DC
  Administered 2016-09-24 – 2016-09-28 (×7): 15 mL via OROMUCOSAL

## 2016-09-24 MED ORDER — HEPARIN SODIUM (PORCINE) 5000 UNIT/ML IJ SOLN
5000.0000 [IU] | Freq: Three times a day (TID) | INTRAMUSCULAR | Status: DC
  Administered 2016-09-24 – 2016-09-25 (×2): 5000 [IU] via SUBCUTANEOUS
  Filled 2016-09-24 (×3): qty 1

## 2016-09-24 MED ORDER — PROPOFOL 1000 MG/100ML IV EMUL
INTRAVENOUS | Status: AC
  Filled 2016-09-24: qty 100

## 2016-09-24 MED ORDER — VANCOMYCIN HCL IN DEXTROSE 750-5 MG/150ML-% IV SOLN
750.0000 mg | Freq: Two times a day (BID) | INTRAVENOUS | Status: DC
  Administered 2016-09-24 – 2016-09-25 (×2): 750 mg via INTRAVENOUS
  Filled 2016-09-24 (×3): qty 150

## 2016-09-24 MED ORDER — SODIUM CHLORIDE 0.9 % IV BOLUS (SEPSIS)
1000.0000 mL | Freq: Once | INTRAVENOUS | Status: AC
Start: 2016-09-24 — End: 2016-09-24
  Administered 2016-09-24: 1000 mL via INTRAVENOUS

## 2016-09-24 MED ORDER — DEXAMETHASONE SODIUM PHOSPHATE 10 MG/ML IJ SOLN
10.0000 mg | Freq: Once | INTRAMUSCULAR | Status: AC
  Administered 2016-09-24: 10 mg via INTRAVENOUS
  Filled 2016-09-24: qty 1

## 2016-09-24 MED ORDER — SODIUM CHLORIDE 0.9 % IV SOLN
INTRAVENOUS | Status: DC
  Administered 2016-09-24 – 2016-09-27 (×5): via INTRAVENOUS

## 2016-09-24 MED ORDER — DEXTROSE-NACL 5-0.45 % IV SOLN: INTRAVENOUS | Status: DC

## 2016-09-24 MED ORDER — FENTANYL BOLUS VIA INFUSION
25.0000 ug | INTRAVENOUS | Status: DC | PRN
  Administered 2016-09-25 – 2016-09-26 (×3): 25 ug via INTRAVENOUS
  Filled 2016-09-24: qty 25

## 2016-09-24 MED ORDER — SUCCINYLCHOLINE CHLORIDE 20 MG/ML IJ SOLN
INTRAMUSCULAR | Status: DC | PRN
  Administered 2016-09-24: 100 mg via INTRAVENOUS

## 2016-09-24 MED ORDER — PROPOFOL 1000 MG/100ML IV EMUL
5.0000 ug/kg/min | Freq: Once | INTRAVENOUS | Status: AC
Start: 2016-09-24 — End: 2016-09-24
  Administered 2016-09-24: 10 ug/kg/min via INTRAVENOUS

## 2016-09-24 MED ORDER — SODIUM CHLORIDE 0.9 % IV SOLN
250.0000 mL | INTRAVENOUS | Status: DC | PRN
  Administered 2016-09-24: 250 mL via INTRAVENOUS

## 2016-09-24 MED ORDER — ASPIRIN 81 MG PO CHEW: 324.0000 mg | CHEWABLE_TABLET | ORAL | Status: DC

## 2016-09-24 MED ORDER — FENTANYL 2500MCG IN NS 250ML (10MCG/ML) PREMIX INFUSION
20.0000 ug/h | INTRAVENOUS | Status: DC
  Administered 2016-09-24: 20 ug/h via INTRAVENOUS
  Filled 2016-09-24: qty 250

## 2016-09-24 MED ORDER — MIDAZOLAM HCL 2 MG/2ML IJ SOLN
4.0000 mg | Freq: Once | INTRAMUSCULAR | Status: AC
  Administered 2016-09-24: 2 mg via INTRAVENOUS

## 2016-09-24 MED ORDER — FENTANYL CITRATE (PF) 100 MCG/2ML IJ SOLN: 50.0000 ug | Freq: Once | INTRAMUSCULAR | Status: DC

## 2016-09-24 MED ORDER — ORAL CARE MOUTH RINSE
15.0000 mL | Freq: Four times a day (QID) | OROMUCOSAL | Status: DC
  Administered 2016-09-25 – 2016-09-28 (×11): 15 mL via OROMUCOSAL

## 2016-09-24 MED ORDER — LORAZEPAM 2 MG/ML IJ SOLN
0.5000 mg | Freq: Once | INTRAMUSCULAR | Status: AC
  Administered 2016-09-24: 1 mg via INTRAVENOUS
  Filled 2016-09-24: qty 1

## 2016-09-24 MED ORDER — SODIUM CHLORIDE 0.9 % IV BOLUS (SEPSIS)
1000.0000 mL | Freq: Once | INTRAVENOUS | Status: AC
  Administered 2016-09-24: 1000 mL via INTRAVENOUS

## 2016-09-24 MED ORDER — SODIUM CHLORIDE 0.9 % IV BOLUS (SEPSIS): 250.0000 mL | Freq: Once | INTRAVENOUS | Status: DC

## 2016-09-24 MED ORDER — FENTANYL 2500MCG IN NS 250ML (10MCG/ML) PREMIX INFUSION
25.0000 ug/h | INTRAVENOUS | Status: DC
  Administered 2016-09-24: 25 ug/h via INTRAVENOUS
  Administered 2016-09-26: 100 ug/h via INTRAVENOUS
  Filled 2016-09-24: qty 250

## 2016-09-24 MED ORDER — INSULIN ASPART 100 UNIT/ML ~~LOC~~ SOLN
0.0000 [IU] | SUBCUTANEOUS | Status: DC
  Administered 2016-09-24 – 2016-09-25 (×2): 2 [IU] via SUBCUTANEOUS
  Administered 2016-09-25 (×2): 1 [IU] via SUBCUTANEOUS

## 2016-09-24 MED ORDER — FENTANYL CITRATE (PF) 100 MCG/2ML IJ SOLN
100.0000 ug | Freq: Once | INTRAMUSCULAR | Status: AC
  Administered 2016-09-24: 100 ug via INTRAVENOUS

## 2016-09-24 MED ORDER — FAMOTIDINE IN NACL 20-0.9 MG/50ML-% IV SOLN
20.0000 mg | Freq: Two times a day (BID) | INTRAVENOUS | Status: DC
  Administered 2016-09-24 – 2016-09-25 (×2): 20 mg via INTRAVENOUS
  Filled 2016-09-24 (×3): qty 50

## 2016-09-24 MED ORDER — SODIUM CHLORIDE 0.9 % IV SOLN
30.0000 meq | Freq: Once | INTRAVENOUS | Status: AC
  Administered 2016-09-24: 30 meq via INTRAVENOUS
  Filled 2016-09-24: qty 15

## 2016-09-24 MED ORDER — RACEPINEPHRINE HCL 2.25 % IN NEBU
0.5000 mL | INHALATION_SOLUTION | Freq: Once | RESPIRATORY_TRACT | Status: AC
  Administered 2016-09-24: 0.5 mL via RESPIRATORY_TRACT
  Filled 2016-09-24: qty 0.5

## 2016-09-24 MED ORDER — ETOMIDATE 2 MG/ML IV SOLN
INTRAVENOUS | Status: DC | PRN
  Administered 2016-09-24: 20 mg via INTRAVENOUS

## 2016-09-24 MED ORDER — FENTANYL CITRATE (PF) 100 MCG/2ML IJ SOLN
INTRAMUSCULAR | Status: AC
  Administered 2016-09-24: 100 ug via INTRAVENOUS
  Filled 2016-09-24: qty 2

## 2016-09-24 MED ORDER — ASPIRIN 300 MG RE SUPP: 300.0000 mg | RECTAL | Status: DC

## 2016-09-24 MED ORDER — SODIUM CHLORIDE 0.9 % IV SOLN
3.0000 g | Freq: Three times a day (TID) | INTRAVENOUS | Status: DC
  Administered 2016-09-24 – 2016-09-27 (×8): 3 g via INTRAVENOUS
  Filled 2016-09-24 (×10): qty 3

## 2016-09-24 NOTE — ED Triage Notes (Signed)
Pt here after becoming apneic  And cyanotic after choking on some chicken at lunch , pt received 2mg  mag , 10mg  albuterol , .5 Atrovent , .3 epi and 125mg  solumedrol  , pt arrived alert and oriented on neb

## 2016-09-24 NOTE — ED Provider Notes (Signed)
[facility] DEPT Provider Note   CSN: [id number] Arrival date & time: 09/24/16  1402     History   Chief Complaint Chief Complaint  Patient presents with  . Choking    HPI Erika Walker is a 70 y.o. female.  HPI Patient presents to the emergency department after an episode of choking which required resuscitation by EMS including positive pressure bag mask ventilation after they found the patient to be apneic and blue.  This episode occurred while she was eating chickenToday.  She has had dysphagia before and has had esophageal dilation 1.  EMS report that when they suctioned her a large piece of thickened mucus came out of her throat.  On my arrival the bedside the patient has obvious inspiratory stridor and says that she feels better but during our discussions began feeling as though her breathing was worsening.  No recent fevers or chills.  No recent cough.  Was doing well earlier today.  She had trauma to her chest 25 years ago and underwent a right-sided pneumonectomy and was trached at that time for several months.  She was seen in the pulmonary office several years ago for bronchospasm   Past Medical History:  Diagnosis Date  . HLD (hyperlipidemia)     Patient Active Problem List   Diagnosis Date Noted  . Acute respiratory failure ([place]) 09/24/2016  . Paroxysmal dyspnea 08/20/2013  . Other dysphagia 07/19/2013  . H/O pneumonectomy 03/31/2010    Past Surgical History:  Procedure Laterality Date  . COLONOSCOPY  2014   Dr Olevia Perches  . lung removed Right 1992   post trauma    OB History    Gravida Para Term Preterm AB Living   3 3 3     3    SAB TAB Ectopic Multiple Live Births                   Home Medications    Prior to Admission medications   Medication Sig Start Date End Date Taking? Authorizing Provider  EPINEPHrine (EPIPEN) 0.3 mg/0.3 mL DEVI Inject 0.3 mLs (0.3 mg total) into the muscle as needed. Patient not taking: Reported on 12/20/2015 11/23/12    Varney Biles, MD    Family History Family History  Problem Relation Age of Onset  . Liver cancer Brother   . Diabetes Brother     pre-diabetic  . Colon cancer Neg Hx   . Pancreatic cancer Neg Hx   . Stomach cancer Neg Hx   . Stroke Neg Hx   . Heart disease Neg Hx   . Esophageal cancer Neg Hx     Social History Social History  Substance Use Topics  . Smoking status: Never Smoker  . Smokeless tobacco: Never Used  . Alcohol use No     Allergies   Patient has no known allergies.   Review of Systems Review of Systems  All other systems reviewed and are negative.    Physical Exam Updated Vital Signs BP 102/60   Pulse 118   Temp 97.6 F (36.4 C) (Oral)   Resp 21   Ht 5\' 6"  (1.676 m)   Wt 153 lb (69.4 kg)   SpO2 100%   BMI 24.69 kg/m   Physical Exam  Constitutional: She is oriented to person, place, and time. She appears well-developed and well-nourished. No distress.  HENT:  Head: Normocephalic and atraumatic.  Eyes: EOM are normal.  Neck:  In speech or stridor.  No neck swelling.  Old tracheostomy  scar present  Cardiovascular: Regular rhythm and normal heart sounds.   Tachycardia  Pulmonary/Chest: Effort normal and breath sounds normal.  Abdominal: Soft. She exhibits no distension. There is no tenderness.  Musculoskeletal: Normal range of motion.  Neurological: She is alert and oriented to person, place, and time.  Skin: Skin is warm and dry.  Psychiatric: She has a normal mood and affect. Judgment normal.  Nursing note and vitals reviewed.    ED Treatments / Results  Labs (all labs ordered are listed, but only abnormal results are displayed) Labs Reviewed  CBC - Abnormal; Notable for the following:       Result Value   WBC 18.9 (*)    All other components within normal limits  BASIC METABOLIC PANEL  LACTIC ACID, PLASMA    EKG  EKG Interpretation None       Radiology Dg Chest Portable 1 View  Result Date: 09/24/2016 CLINICAL DATA:   Post intubation EXAM: PORTABLE CHEST 1 VIEW COMPARISON:  09/24/2016 FINDINGS: Endotracheal tube is 6 cm above the carina. Complete opacification of the right hemithorax from prior pneumonectomy. Patchy airspace disease in the left lower lobe. Cannot exclude pneumonia or aspiration. Heart is likely normal size. IMPRESSION: New patchy airspace disease in the left lower lobe concerning for pneumonia. Cannot exclude aspiration. Prior right pneumonectomy Electronically Signed   By: Rolm Baptise M.D.   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 Mediastinal shift to the right is stable. The left lung is clear and somewhat hyperaerated. Heart size is difficult to assess. No bony abnormality is seen. 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 The left lung is clear. Electronically Signed   By: Ivar Drape M.D.   On: 09/24/2016 14:49    Procedures Procedures (including critical care time)   +++++++++++++++++++++++++++++++++++++++++++++++++++++++++++++++  CRITICAL CARE Performed by: Hoy Morn Total critical care time: 30 minutes Critical care time was exclusive of separately billable procedures and treating other patients. Critical care was necessary to treat or prevent imminent or life-threatening deterioration. Critical care was time spent personally by me on the following activities: development of treatment plan with patient and/or surrogate as well as nursing, discussions with consultants, evaluation of patient's response to treatment, examination of patient, obtaining history from patient or surrogate, ordering and performing treatments and  interventions, ordering and review of laboratory studies, ordering and review of radiographic studies, pulse oximetry and re-evaluation of patient's condition.   INTUBATION Performed by: Hoy Morn  Required items: required blood products, implants, devices, and special equipment available Patient identity confirmed: provided demographic data and hospital-assigned identification number Time out: Immediately prior to procedure a "time out" was called to verify the correct patient, procedure, equipment, support staff and site/side marked as required. Indications: Respiratory failure/upper airway obstruction  Intubation method: Glidescope Laryngoscopy  Preoxygenation: BVM Sedatives: Etomidate Paralytic: Succinylcholine Tube Size: 6.5 cuffed Post-procedure assessment: chest rise and ETCO2 monitor Breath sounds: equal and absent over the epigastrium Tube secured with: ETT holder Chest x-ray interpreted by radiologist and me. Chest x-ray findings: endotracheal tube in appropriate position  Patient tolerated the procedure well with no immediate complications.   +++++++++++++++++++++++++++++++++++++++++++++++++++++++++++++   Medications Ordered in ED Medications  etomidate (AMIDATE) injection (20 mg Intravenous Given 09/24/16 1544)  succinylcholine (ANECTINE) injection (100 mg Intravenous Given 09/24/16 1540)  fentaNYL 2557mcg in NS 246mL (32mcg/ml) infusion-PREMIX (not administered)  Racepinephrine HCl 2.25 % nebulizer solution 0.5 mL (0.5 mLs Nebulization Given 09/24/16 1528)  LORazepam (ATIVAN) injection 0.5 mg (1 mg Intravenous Given 09/24/16 1530)  midazolam (VERSED) injection 4 mg (2 mg Intravenous Given 09/24/16 1555)  fentaNYL (SUBLIMAZE) injection 100 mcg (100 mcg Intravenous Given 09/24/16 1554)  propofol (DIPRIVAN) 1000 MG/100ML infusion (0 mcg/kg/min  69.4 kg Intravenous Stopped 09/24/16 1600)  sodium chloride 0.9 % bolus 1,000 mL (1,000 mLs Intravenous New Bag/Given 09/24/16  1605)  dexamethasone (DECADRON) injection 10 mg (10 mg Intravenous Given 09/24/16 1632)     Initial Impression / Assessment and Plan / ED Course  I have reviewed the triage vital signs and the nursing notes.  Pertinent labs & imaging results that were available during my care of the patient were reviewed by me and considered in my medical decision making (see chart for details).  Clinical Course     On arrival to patient initially looked good.  She then began to worsen when she worsened she acutely worsened.  Despite nonrebreather and racemic epinephrine she continued to worsen and began having worsening stridor the point of minimal air movement.  She was not ventilating.  On a nonrebreather her sats fell and the 70s and she was intubated for airway protection.  From a supraglottic standpoint her upper airway showed no significant abnormalities.  I suspect subglottic stenosis as initially I attempted to pass a 7.5 as able to barely pass a 7.5 endotracheal tube past records.  It was able to pass however it was extremely tight.  This was removed as I was concerned that I would cause more edema and hospital he bleeding.  This was replaced with a 6.5 endotracheal tube which passed much easier.  Pulmonary critical care consult it.  Critical care requesting IV Decadron as well as CT neck and chest.  Pulmonary will perform bronchoscopy the bedside now.  Final Clinical Impressions(s) / ED Diagnoses   Final diagnoses:  Stridor  Acute respiratory failure, unspecified whether with hypoxia or hypercapnia [facility])    New Prescriptions New Prescriptions   No medications on file     Jola Schmidt, MD 09/24/16 [phone]

## 2016-09-24 NOTE — ED Notes (Signed)
Pt placed back in room from CT with this RN

## 2016-09-24 NOTE — Progress Notes (Signed)
Critical lactic acid of 6 called and reported to Dr. Melvyn Novas. No new orders at this time.

## 2016-09-24 NOTE — ED Notes (Signed)
Pt being taken to CT.

## 2016-09-24 NOTE — ED Notes (Signed)
Xray at the bedside.

## 2016-09-24 NOTE — Progress Notes (Signed)
   Consulted re: food/debris in esophagus. Patient presented with respiratory difficulty and choking today related to eating chicken and ? Of bone ingestion. Got intubated in ED - has aspiration PNA. Had long hx dysphagia issues though no stricture on recent EGD. 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 CT shows a fluid/debris-filled esophagus w/ AFL. No chicken bones. I spoke to RN caring for patient - no oral secretions to speak of as one would see w/ food impaction. Doubt she has impacted food though suppose possible - think ok to observe and we will see her in AM, sooner prn  Gatha Mayer, MD, [facility] [phone] (pager) [phone] after 5 PM, weekends and holidays  09/24/2016 10:46 PM

## 2016-09-24 NOTE — ED Notes (Signed)
Pt trying to pull at medication and continuing to struggle to breathe. Campos MD at the bedside with patient. Reassuring the patient.

## 2016-09-24 NOTE — Progress Notes (Addendum)
Pharmacy Antibiotic Note  Erika Walker is a 70 y.o. female admitted on 09/24/2016 with pneumonia.  Pharmacy has been consulted for unasyn dosing.  Plan: Unasyn 3 g q8h Vancomycin 750 mg q12h Monitor renal fx, cx  Height: 5\' 6"  (167.6 cm) Weight: 153 lb (69.4 kg) IBW/kg (Calculated) : 59.3  Temp (24hrs), Avg:97.6 F (36.4 C), Min:97.6 F (36.4 C), Max:97.6 F (36.4 C)   Recent Labs Lab 09/24/16 1619  WBC 18.9*  CREATININE 1.18*    Estimated Creatinine Clearance: 42.1 mL/min (by C-G formula based on SCr of 1.18 mg/dL (H)).    No Known Allergies  Levester Fresh, PharmD, BCPS, BCCCP Clinical Pharmacist Clinical phone for 09/24/2016 [phone] 09/24/2016 4:58 PM

## 2016-09-24 NOTE — ED Notes (Signed)
Pt complaining of having increase SOB. Pt struggling to breath and able to verbalize at this time. Pt encouraged to calm and given medication. Pt is alert and restless at this time.

## 2016-09-24 NOTE — ED Notes (Signed)
Phlebotomy at the bedside  

## 2016-09-24 NOTE — H&P (Signed)
PULMONARY / CRITICAL CARE MEDICINE   Name: Erika Walker MRN: [id number] DOB: 26-Jun-1946    ADMISSION DATE:  09/24/2016 CONSULTATION DATE:  12/7  REFERRING MD:  Dr Venora Maples  CHIEF COMPLAINT:  Acute resp failure, aspiration, chocking episode on chicken  HISTORY OF PRESENT ILLNESS:  70 yr old history chest truama, with trach 25 years ago requiring RT pneumonectomy and trach then and history esoph dilation, myltiple er viists for chokcing who was found again choking which required resuscitation by EMS including positive pressure bag mask ventilation after they found the patient to be apneic and blue.  She was eating chicken whehn this occurred.  She has had dysphagia before and has had esophageal dilation 1.  EMS report that when they suctioned her a large piece of thickened mucus came out of her throat.  EDP reported inspiratory stridor. Requird emergent intubation. Cords wnl, intially 7.5 attempted then rmeoved as hit some subglottic obstruction. ETT changed to 6.5.  Called for admission.  PAST MEDICAL HISTORY :  She  has a past medical history of HLD (hyperlipidemia). esoph dilation Chocking in past Trach  PAST SURGICAL HISTORY: She  has a past surgical history that includes lung removed (Right, 1992) and Colonoscopy (2014). Trach  No Known Allergies  No current facility-administered medications on file prior to encounter.    Current Outpatient Prescriptions on File Prior to Encounter  Medication Sig  . EPINEPHrine (EPIPEN) 0.3 mg/0.3 mL DEVI Inject 0.3 mLs (0.3 mg total) into the muscle as needed. (Patient not taking: Reported on 12/20/2015)    FAMILY HISTORY:  Her indicated that the status of her brother is unknown. She indicated that the status of her neg hx is unknown.    SOCIAL HISTORY: She  reports that she has never smoked. She has never used smokeless tobacco. She reports that she does not drink alcohol or use drugs.  REVIEW OF SYSTEMS:   Unable to  obtain  SUBJECTIVE:  Sedated, on vent  VITAL SIGNS: BP 100/69 (BP Location: Left Arm)   Pulse 120   Temp 97.6 F (36.4 C) (Oral)   Resp 17   Ht 5\' 6"  (1.676 m)   Wt 69.4 kg (153 lb)   SpO2 100%   BMI 24.69 kg/m   HEMODYNAMICS:    VENTILATOR SETTINGS: Vent Mode: PRVC FiO2 (%):  [100 %] 100 % Set Rate:  [18 bmp] 18 bmp Vt Set:  [350 mL] 350 mL PEEP:  [5 cmH20] 5 cmH20 Plateau Pressure:  [19 cmH20] 19 cmH20  INTAKE / OUTPUT: No intake/output data recorded.  PHYSICAL EXAMINATION: General:  On vent, normal pressures Neuro:  Prrl, 3 mm, rass -2 HEENT:  Trach scar, long airway Cardiovascular:  s1 s2 rrr distant Lungs: ronchi LLL, NO BS rt Abdomen:  Soft, bs wnl,no r/g Musculoskeletal: no edema Skin:  No rash  LABS:  BMET No results for input(s): NA, K, CL, CO2, BUN, CREATININE, GLUCOSE in the last 168 hours.  Electrolytes No results for input(s): CALCIUM, MG, PHOS in the last 168 hours.  CBC  Recent Labs Lab 09/24/16 1619  WBC 18.9*  HGB 13.7  HCT 40.7  PLT 251    Coag's No results for input(s): APTT, INR in the last 168 hours.  Sepsis Markers No results for input(s): LATICACIDVEN, PROCALCITON, O2SATVEN in the last 168 hours.  ABG No results for input(s): PHART, PCO2ART, PO2ART in the last 168 hours.  Liver Enzymes No results for input(s): AST, ALT, ALKPHOS, BILITOT, ALBUMIN in the last 168  hours.  Cardiac Enzymes No results for input(s): TROPONINI, PROBNP in the last 168 hours.  Glucose No results for input(s): GLUCAP in the last 168 hours.  Imaging Dg Chest Portable 1 View  Result Date: 09/24/2016 CLINICAL DATA:  Post intubation EXAM: PORTABLE CHEST 1 VIEW COMPARISON:  09/24/2016 FINDINGS: Endotracheal tube is 6 cm above the carina. Complete opacification of the right hemithorax from prior pneumonectomy. Patchy airspace disease in the left lower lobe. Cannot exclude pneumonia or aspiration. Heart is likely normal size. IMPRESSION: New patchy  airspace disease in the left lower lobe concerning for pneumonia. Cannot exclude aspiration. Prior right pneumonectomy Electronically Signed   By: Rolm Baptise M.D.   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 Mediastinal shift to the right is stable. The left lung is clear and somewhat hyperaerated. Heart size is difficult to assess. No bony abnormality is seen. 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 The left lung is clear. Electronically Signed   By: Ivar Drape M.D.   On: 09/24/2016 14:49     STUDIES:  12/7 ct neck>>> 12/7 ct chest>>>  CULTURES: 12/7 BC>>> 12.7 BAL LLL>>>  ANTIBIOTICS: 12/7 unasyn>>> 12/7 vanc>>>  SIGNIFICANT EVENTS: 12/7- chock chicke, obstruction airway, small ETT plaed, Bronch  LINES/TUBES: 12/7 ETT>>>   ASSESSMENT / PLAN:  PULMONARY A: Aspiration event precipitated by chocking on chicken Subglottic obstruction, r/o tracheal stenosis/granulation post trach 1990 LLL PNA likely S/p RT Pneumonectomy P:   Emergent bronch, assess obstruction as able with some Withdrawing of ett, r/o retained chicken, r/o TE fistula) For approx 4 cc/kg, ABG reviewed, rate to 30 Stat abg to follow CT chest CT neck See ID Decadron Empiric Will likely need ENT assessment in OR for extubation when appropriate Keep plat less 30  CARDIOVASCULAR A:  LActic acidosis (r/o secondary to sepsis vs resp failure) P:  Bolus further Call code sepsis Complete 30 cc/kg, repeat lactic May need line Remains with Normal BP   I performed repeat assessment at 450 pm   RENAL A:   Hypokalemia, r/o ATN P:   Volume k  supp bmet in am   GASTROINTESTINAL A:   History of Esoph dilation P:   OGt as able CT neck pepcid LFT in am  May need EGD  HEMATOLOGIC A:   DV T prevention P:  Sub q hep cbc  INFECTIOUS A:   ASP event, LLL PNA likely R/o Sepsis P:   CT chest unasyn vanc BC I called a code sepsis, repeat lactic planned  ENDOCRINE A:   Airway swelling?   P:   Decadron Add ssi  NEUROLOGIC A:   Vent dyschrony P:   RASS goal: -2 Low BP in ED, dc prop Add fent drip   FAMILY  - Updates: I called then updated brother at bedside, HE stayed for and observed the bronch. Updated on plan of care  - Inter-disciplinary family meet or Palliative Care meeting due by:  12/14   Ccm tim 90- min  Lavon Paganini. Titus Mould, MD, FACP Pgr: [facility] ' Pulmonary and [place] Pager: [phone]  09/24/2016, 4:48 PM

## 2016-09-24 NOTE — Procedures (Signed)
Bedside Bronchoscopy Procedure Note DINITA HEINEY [id number] 05/24/1946  Procedure: Bronchoscopy Indications: Diagnostic evaluation of the airways, Obtain specimens for culture and/or other diagnostic studies and Remove secretions  Procedure Details: ET Tube Size: 6.5 ET Tube secured at lip (cm):22 Bite block in place: Yes In preparation for procedure, Patient hyper-oxygenated with 100 % FiO2 Airway entered and the following bronchi were examined: LUL, LLL and Bronchi.   Bronchoscope removed.  , Patient placed back on 100% FiO2 at conclusion of procedure.    Evaluation BP 106/59   Pulse 76   Temp 97.6 F (36.4 C) (Oral)   Resp 18   Ht 5\' 6"  (1.676 m)   Wt 153 lb (69.4 kg)   SpO2 100%   BMI 24.69 kg/m  Breath Sounds:Diminished O2 sats: stable throughout Patient's Current Condition: stable Specimens: bronchial was for culture and sensitivity   Complications: No apparent complications Patient did tolerate procedure well.   Claretta Fraise 09/24/2016, 5:32 PM

## 2016-09-24 NOTE — Progress Notes (Signed)
eLink Physician-Brief Progress Note Patient Name: LATA MEHRING DOB: 1946/09/03 MRN: [id number]   Date of Service  09/24/2016  HPI/Events of Note  Sedated on vent/ nursing req foley  eICU Interventions  Foley ordered     Intervention Category Minor Interventions: Routine modifications to care plan (e.g. PRN medications for pain, fever)  Christinia Gully 09/24/2016, 8:01 PM

## 2016-09-24 NOTE — ED Notes (Signed)
Report called  

## 2016-09-24 NOTE — ED Notes (Signed)
All belongings rings and cell phone given to [facility]

## 2016-09-24 NOTE — ED Notes (Signed)
Attempted report 

## 2016-09-24 NOTE — Procedures (Signed)
Bronchoscopy Procedure Note AMAIRAH KOELLNER [id number] 1945/11/10  Procedure: Bronchoscopy Indications: Diagnostic evaluation of the airways and Obtain specimens for culture and/or other diagnostic studies  Procedure Details Consent: Risks of procedure as well as the alternatives and risks of each were explained to the (patient/caregiver).  Consent for procedure obtained. Time Out: Verified patient identification, verified procedure, site/side was marked, verified correct patient position, special equipment/implants available, medications/allergies/relevent history reviewed, required imaging and test results available.  Performed  In preparation for procedure, patient was given 100% FiO2. Sedation: Etomidate  Airway entered and the following bronchi were examined: LUL, LLL and Bronchi.   Procedures performed: Brushings performed- no Bronchoscope removed.  , Patient placed back on 100% FiO2 at conclusion of procedure.    Evaluation Hemodynamic Status: BP stable throughout; O2 sats: stable throughout Patient's Current Condition: stable Specimens:  Sent serosanguinous fluid Complications: No apparent complications Patient did tolerate procedure well.   Raylene Miyamoto. 09/24/2016   1. No right lung 2. 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 BAL serous LLL 4. backed up ETT to 19 cm , no stenosis noted 5. Never lost airway 6. No obstruction  Lavon Paganini. Titus Mould, MD, [place] Pgr: [facility]

## 2016-09-24 NOTE — ED Notes (Signed)
Admitting MD at the bedside.  

## 2016-09-24 NOTE — ED Notes (Signed)
Patient has become unresponsive. Oxygen dropped to 76% on NRB. Pt was started to be bagged at this time. MD aware and at bedside.

## 2016-09-24 NOTE — ED Notes (Signed)
Attempted OG placement x2. [place], RN attempted OG placement x1. NO confirmation noted. Removed.

## 2016-09-24 NOTE — Progress Notes (Signed)
Pt. Was transported to CT & back to trauma C without any complications.

## 2016-09-25 ENCOUNTER — Encounter (HOSPITAL_COMMUNITY)
Admission: EM | Disposition: A | Discharge status: Home / Self Care | Payer: Self-pay | Source: Home / Self Care | Attending: Internal Medicine

## 2016-09-25 ENCOUNTER — Encounter (HOSPITAL_COMMUNITY): Payer: Self-pay

## 2016-09-25 ENCOUNTER — Ambulatory Visit: Payer: Self-pay | Admitting: Otolaryngology

## 2016-09-25 DIAGNOSIS — J690 Pneumonitis due to inhalation of food and vomit: Secondary | ICD-10-CM

## 2016-09-25 DIAGNOSIS — J9601 Acute respiratory failure with hypoxia: Secondary | ICD-10-CM

## 2016-09-25 DIAGNOSIS — K222 Esophageal obstruction: Secondary | ICD-10-CM

## 2016-09-25 DIAGNOSIS — R1310 Dysphagia, unspecified: Secondary | ICD-10-CM

## 2016-09-25 DIAGNOSIS — K2100 Gastro-esophageal reflux disease with esophagitis, without bleeding: Secondary | ICD-10-CM

## 2016-09-25 DIAGNOSIS — T18128A Food in esophagus causing other injury, initial encounter: Secondary | ICD-10-CM

## 2016-09-25 DIAGNOSIS — R1314 Dysphagia, pharyngoesophageal phase: Secondary | ICD-10-CM

## 2016-09-25 HISTORY — PX: ESOPHAGOGASTRODUODENOSCOPY: SHX5428

## 2016-09-25 LAB — CBC
HEMATOCRIT: 38.1 % (ref 36.0–46.0)
HEMOGLOBIN: 13 g/dL (ref 12.0–15.0)
MCH: 31.8 pg (ref 26.0–34.0)
MCHC: 34.1 g/dL (ref 30.0–36.0)
MCV: 93.2 fL (ref 78.0–100.0)
Platelets: 188 10*3/uL (ref 150–400)
RBC: 4.09 MIL/uL (ref 3.87–5.11)
RDW: 12.5 % (ref 11.5–15.5)
WBC: 11.9 10*3/uL — AB (ref 4.0–10.5)

## 2016-09-25 LAB — GLUCOSE, CAPILLARY
GLUCOSE-CAPILLARY: 95 mg/dL (ref 65–99)
GLUCOSE-CAPILLARY: 103 mg/dL — AB (ref 65–99)
Glucose-Capillary: 152 mg/dL — ABNORMAL HIGH (ref 65–99)
Glucose-Capillary: 136 mg/dL — ABNORMAL HIGH (ref 65–99)
Glucose-Capillary: 117 mg/dL — ABNORMAL HIGH (ref 65–99)
Glucose-Capillary: 116 mg/dL — ABNORMAL HIGH (ref 65–99)

## 2016-09-25 LAB — BASIC METABOLIC PANEL
ANION GAP: 11 (ref 5–15)
BUN: 15 mg/dL (ref 6–20)
CHLORIDE: 110 mmol/L (ref 101–111)
CO2: 17 mmol/L — ABNORMAL LOW (ref 22–32)
Calcium: 7.9 mg/dL — ABNORMAL LOW (ref 8.9–10.3)
Creatinine, Ser: 1 mg/dL (ref 0.44–1.00)
GFR calc Af Amer: >60 mL/min (ref >60)
GFR, EST NON AFRICAN AMERICAN: 56 mL/min — AB (ref >60)
Glucose, Bld: 153 mg/dL — ABNORMAL HIGH (ref 65–99)
POTASSIUM: 4.8 mmol/L (ref 3.5–5.1)
SODIUM: 138 mmol/L (ref 135–145)

## 2016-09-25 LAB — BLOOD GAS, ARTERIAL
ACID-BASE DEFICIT: 4.7 mmol/L — AB (ref 0.0–2.0)
BICARBONATE: 18.9 mmol/L — AB (ref 20.0–28.0)
DRAWN BY: 44166
FIO2: 40
LHR: 300 {breaths}/min
MECHVT: 300 mL
O2 SAT: 94.1 %
PATIENT TEMPERATURE: 98.6
PCO2 ART: 29.1 mmHg — AB (ref 32.0–48.0)
PEEP/CPAP: 5 cmH2O
PH ART: 7.428 (ref 7.350–7.450)
PO2 ART: 66.4 mmHg — AB (ref 83.0–108.0)

## 2016-09-25 SURGERY — EGD (ESOPHAGOGASTRODUODENOSCOPY)
Anesthesia: Moderate Sedation

## 2016-09-25 MED ORDER — MIDAZOLAM HCL 2 MG/2ML IJ SOLN
1.0000 mg | INTRAMUSCULAR | Status: DC | PRN
  Administered 2016-09-26: 2 mg via INTRAVENOUS

## 2016-09-25 MED ORDER — MIDAZOLAM HCL 5 MG/ML IJ SOLN
INTRAMUSCULAR | Status: AC
  Filled 2016-09-25: qty 2

## 2016-09-25 MED ORDER — EPINEPHRINE PF 1 MG/10ML IJ SOSY
PREFILLED_SYRINGE | INTRAMUSCULAR | Status: AC
  Filled 2016-09-25: qty 10

## 2016-09-25 MED ORDER — FENTANYL CITRATE (PF) 100 MCG/2ML IJ SOLN
INTRAMUSCULAR | Status: DC | PRN
  Administered 2016-09-25: 25 ug via INTRAVENOUS

## 2016-09-25 MED ORDER — FENTANYL CITRATE (PF) 100 MCG/2ML IJ SOLN
INTRAMUSCULAR | Status: AC
  Filled 2016-09-25: qty 4

## 2016-09-25 MED ORDER — DIPHENHYDRAMINE HCL 50 MG/ML IJ SOLN
INTRAMUSCULAR | Status: AC
  Filled 2016-09-25: qty 1

## 2016-09-25 MED ORDER — MIDAZOLAM HCL 10 MG/2ML IJ SOLN
INTRAMUSCULAR | Status: DC | PRN
  Administered 2016-09-25: 2 mg via INTRAVENOUS

## 2016-09-25 MED ORDER — PANTOPRAZOLE SODIUM 40 MG IV SOLR
40.0000 mg | Freq: Two times a day (BID) | INTRAVENOUS | Status: DC
  Administered 2016-09-25 – 2016-09-27 (×4): 40 mg via INTRAVENOUS
  Filled 2016-09-25 (×4): qty 40

## 2016-09-25 MED ORDER — HEPARIN SODIUM (PORCINE) 5000 UNIT/ML IJ SOLN
5000.0000 [IU] | Freq: Three times a day (TID) | INTRAMUSCULAR | Status: DC
  Administered 2016-09-25 – 2016-09-28 (×8): 5000 [IU] via SUBCUTANEOUS
  Filled 2016-09-25 (×8): qty 1

## 2016-09-25 NOTE — Progress Notes (Signed)
Initial Nutrition Assessment  DOCUMENTATION CODES:   Not applicable  INTERVENTION:    If unable to extubate patient in the next 24 hours, suggest initiating TF via OGT with Vital AF 1.2 at goal rate of 55 ml/h (1320 ml per day) to provide 1584 kcals, 99 gm protein, 1071 ml free water daily.  NUTRITION DIAGNOSIS:   Inadequate oral intake related to inability to eat as evidenced by NPO status.  GOAL:   Patient will meet greater than or equal to 90% of their needs  MONITOR:   Vent status, Labs, I & O's  REASON FOR ASSESSMENT:   Ventilator    ASSESSMENT:   70 year old female brought to the ED s/p choking episode. History of multiple choking events, dysphagia, esophageal dilatation. Intubated in ED, chest CT suggested aspiration.  Discussed patient with RN today. Likely will not be extubated today. Plans for EGD this afternoon.  Patient is currently intubated on ventilator support MV: 9.2 L/min Temp (24hrs), Avg:98.8 F (37.1 C), Min:97.6 F (36.4 C), Max:99.3 F (37.4 C)  Propofol: off  Diet Order:  Diet NPO time specified  Skin:  Reviewed, no issues  Last BM:  PTA  Height:   Ht Readings from Last 1 Encounters:  09/24/16 5\' 6"  (1.676 m)    Weight:   Wt Readings from Last 1 Encounters:  09/25/16 163 lb 12.8 oz (74.3 kg)    Ideal Body Weight:  59.1 kg  BMI:  Body mass index is 26.44 kg/m.  Estimated Nutritional Needs:   Kcal:  [id number]  Protein:  95-110 gm  Fluid:  1.6-1.8 L  EDUCATION NEEDS:   No education needs identified at this time  Molli Barrows, [place], [place], [place] Pager [phone] After Hours Pager [phone]

## 2016-09-25 NOTE — Consult Note (Signed)
Referring Provider: [place] Physician:  Binnie Rail, MD Primary Gastroenterologist:  Previously Delfin Edis, MD, now Dr. Ardis Hughs  Reason for Consultation: food impaction  HPI: Erika Walker is a 70 y.o. female admitted with acute respiratory failure. 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 Patient is intubated on ventilator but able to communicate through writing. Patient was eating a piece of chicken last evening. She knew the chicken became lodged in her esophagus but was unable to get it down or up. She call 911. Upon arrival patient required resuscitation by EMS as she was apneic and blue. In ED she had inspiratory stridor , breather further deteriorated and she was intubated. CT scan of chest suggests aspiration. Dilated esophagus with debris throughout. Patient writes that leading up to this event she was not having any dysphagia. No heartburn. She doesn't chew well enough sometimes.  In 25 years she has had 4 episodes of dysphagia but never an impaction. She lives alone, husband is deceased.    Past Medical History:  Diagnosis Date  . HLD (hyperlipidemia)     Past Surgical History:  Procedure Laterality Date  . COLONOSCOPY  2014   Dr Olevia Perches  . lung removed Right 1992   post trauma    Prior to Admission medications   Medication Sig Start Date End Date Taking? Authorizing Provider  EPINEPHrine (EPIPEN) 0.3 mg/0.3 mL DEVI Inject 0.3 mLs (0.3 mg total) into the muscle as needed. Patient not taking: Reported on 09/24/2016 11/23/12   Varney Biles, MD    Current Facility-Administered Medications  Medication Dose Route Frequency Provider Last Rate Last Dose  . 0.9 %  sodium chloride infusion  250 mL Intravenous PRN Raylene Miyamoto, MD 10 mL/hr at 09/25/16 0600 250 mL at 09/25/16 0600  . 0.9 %  sodium chloride infusion   Intravenous Continuous Raylene Miyamoto, MD 75 mL/hr at 09/25/16 [phone]    . Ampicillin-Sulbactam (UNASYN) 3 g in sodium  chloride 0.9 % 100 mL IVPB  3 g Intravenous Q8H Wynell Balloon, RPH 200 mL/hr at 09/25/16 0306 3 g at 09/25/16 0306  . aspirin chewable tablet 324 mg  324 mg Oral NOW Raylene Miyamoto, MD       Or  . aspirin suppository 300 mg  300 mg Rectal NOW Raylene Miyamoto, MD      . chlorhexidine gluconate (MEDLINE KIT) (PERIDEX) 0.12 % solution 15 mL  15 mL Mouth Rinse BID Raylene Miyamoto, MD   15 mL at 09/25/16 0744  . etomidate (AMIDATE) injection   Intravenous PRN Jola Schmidt, MD   20 mg at 09/24/16 1544  . famotidine (PEPCID) IVPB 20 mg premix  20 mg Intravenous Q12H Raylene Miyamoto, MD   20 mg at 09/25/16 0900  . fentaNYL (SUBLIMAZE) bolus via infusion 25 mcg  25 mcg Intravenous Q1H PRN Raylene Miyamoto, MD   25 mcg at 09/25/16 0947  . fentaNYL (SUBLIMAZE) injection 50 mcg  50 mcg Intravenous Once Raylene Miyamoto, MD      . fentaNYL 259mg in NS 2535m(1031mml) infusion-PREMIX  25-400 mcg/hr Intravenous Continuous DanRaylene MiyamotoD 5 mL/hr at 09/25/16 0600 50 mcg/hr at 09/25/16 0600  . heparin injection 5,000 Units  5,000 Units Subcutaneous Q8H DanRaylene MiyamotoD   5,000 Units at 09/25/16 [id number] insulin aspart (novoLOG) injection 0-9 Units  0-9 Units Subcutaneous Q4H DanRaylene MiyamotoD   1 Units at 09/25/16  5621  . MEDLINE mouth rinse  15 mL Mouth Rinse QID Raylene Miyamoto, MD   15 mL at 09/25/16 0039  . sodium chloride 0.9 % bolus 250 mL  250 mL Intravenous Once Raylene Miyamoto, MD      . succinylcholine (ANECTINE) injection   Intravenous PRN Jola Schmidt, MD   100 mg at 09/24/16 1540  . vancomycin (VANCOCIN) IVPB 750 mg/150 ml premix  750 mg Intravenous Q12H Wynell Balloon, RPH   750 mg at 09/25/16 3086    Allergies as of 09/24/2016  . (No Known Allergies)    Family History  Problem Relation Age of Onset  . Liver cancer Brother   . Diabetes Brother     pre-diabetic  . Colon cancer Neg Hx   . Pancreatic cancer Neg Hx   . Stomach cancer Neg Hx   .  Stroke Neg Hx   . Heart disease Neg Hx   . Esophageal cancer Neg Hx     Social History   Social History  . Marital status: Married    Spouse name: N/A  . Number of children: 3  . Years of education: N/A   Occupational History  . realtor [employer]   Social History Main Topics  . Smoking status: Never Smoker  . Smokeless tobacco: Never Used  . Alcohol use No  . Drug use: No  . Sexual activity: Yes    Birth control/ protection: Post-menopausal   Other Topics Concern  . Not on file   Social History Narrative  . No narrative on file    Review of Systems: All systems reviewed and negative except where noted in HPI.  Physical Exam: Vital signs in last 24 hours: Temp:  [97.6 F (36.4 C)-99.3 F (37.4 C)] 99.3 F (37.4 C) (12/08 0800) Pulse Rate:  [62-149] 81 (12/08 0900) Resp:  [15-30] 23 (12/08 0900) BP: (80-226)/(33-137) 145/74 (12/08 0831) SpO2:  [93 %-100 %] 100 % (12/08 0900) FiO2 (%):  [40 %-100 %] 40 % (12/08 0900) Weight:  [153 lb (69.4 kg)-163 lb 12.8 oz (74.3 kg)] 163 lb 12.8 oz (74.3 kg) (12/08 0350) Last BM Date:  (PTA) General:   Alert,  Well-developed, white female in NAD Head:  Normocephalic and atraumatic. Eyes:  Sclera clear, no icterus.   Conjunctiva pink. Ears:  Normal auditory acuity. Nose:  No deformity, discharge,  or lesions. Neck:  Supple; no masses  Lungs:  No wheezes heard. Decreased breath sounds at bases. Intubated on vent Heart:  Regular rate and rhythm; no murmurs, no peripheral edema Abdomen:  Soft,nontender, BS active,nonpalp mass or hsm.   Rectal:  Deferred  Msk:  Symmetrical without gross deformities. . Pulses:  Normal pulses noted. Extremities:  Without clubbing or edema. Neurologic:  Alert and  oriented x4;  grossly normal neurologically. Skin:  Intact without significant lesions or rashes.. Psych:  Alert and cooperative. Normal mood and affect.  Intake/Output from previous day: 12/07 0701 - 12/08 0700 In: 1770.1  [I.V.:970.1; IV Piggyback:800] Out: 700 [Urine:700] Intake/Output this shift: Total I/O In: 90 [I.V.:90] Out: -   Lab Results:  Recent Labs  09/24/16 1619 09/24/16 2011 09/25/16 0221  WBC 18.9* 14.3* 11.9*  HGB 13.7 14.5 13.0  HCT 40.7 43.9 38.1  PLT 251 213 188   BMET  Recent Labs  09/24/16 1619 09/24/16 2011 09/25/16 0221  NA 137  --  138  K 3.3*  --  4.8  CL 106  --  110  CO2 21*  --  17*  GLUCOSE 247*  --  153*  BUN 19  --  15  CREATININE 1.18* 1.17* 1.00  CALCIUM 7.8*  --  7.9*    Studies/Results: Ct Soft Tissue Neck Wo Contrast  Result Date: 09/24/2016 CLINICAL DATA:  Difficulty breathing. Question subglottic stenosis. Prior pneumonectomy. Patient choked on chicken bone. EXAM: CT NECK WITHOUT CONTRAST TECHNIQUE: Multidetector CT imaging of the neck was performed following the standard protocol without intravenous contrast. COMPARISON:  CT chest today.  Chest x-ray today. FINDINGS: Pharynx and larynx: Endotracheal tube passes lateral to the tongue on the left displacing the tongue to the right. No tongue mass is identified. Endotracheal tube is in good position in the mid trachea. The balloon is inflated. No subglottic stenosis identified. No retained foreign body air chicken bone is present. The esophagus is dilated and filled with food and air-fluid level. The esophagus is present to the right of midline related to prior pneumonectomy. Salivary glands: Negative Thyroid: Negative Lymph nodes: No enlarged or pathologic lymph nodes. Vascular: Vascular patency not evaluated without intravenous contrast. Limited intracranial: Negative Visualized orbits: Negative Mastoids and visualized paranasal sinuses: Mild mucosal edema left maxillary sinus. Remaining sinuses clear. Skeleton: Cervical disc degeneration and mild spurring. No acute skeletal abnormality. Upper chest: Right pneumonectomy. Dilated esophagus with air-fluid level and retained food. Mild patchy infiltrate left  upper lobe. Chest CT reported separately Other: None IMPRESSION: Endotracheal tube in the midtrachea.  No subglottic stenosis. No retained foreign body or chicken bone. Dilated esophagus with air-fluid level in retained food. Negative for mass lesion. Electronically Signed   By: Franchot Gallo M.D.   On: 09/24/2016 17:23   Ct Chest Wo Contrast  Result Date: 09/24/2016 CLINICAL DATA:  Difficulty breathing. Question subglottic stenosis. Prior pneumonectomy. Possible choking. EXAM: CT CHEST WITHOUT CONTRAST TECHNIQUE: Multidetector CT imaging of the chest was performed following the standard protocol without IV contrast. COMPARISON:  Plain films of earlier today.  No prior CT. FINDINGS: Cardiovascular: Aortic and branch vessel atherosclerosis. Mediastinal shift to the right. Cardiomegaly. No pericardial effusion. probable left main coronary artery atherosclerosis. Right coronary artery atherosclerosis. Mediastinum/Nodes: No supraclavicular adenopathy. limited evaluation for thoracic adenopathy secondary to anatomic distortion and lack of IV contrast. No gross adenopathy identified. The esophagus is dilated and debris filled throughout. This extends to the level of the thoracic inlet, including on image 33/series 3. Lungs/Pleura: No pleural fluid. Status post right-sided pneumonectomy, without air in the pneumonectomy bed. Degradation secondary to patient arm position and mild motion. No other radiopaque foreign body identified within the trachea. Hyperexpansion of the left lung. Dependent lower lobe predominant patchy airspace and ground-glass opacity with septal thickening. Upper Abdomen: Degradation continuing into the upper abdomen, including secondary to EKG lead artifact. Grossly normal noncontrast appearance of the liver, spleen, stomach, adrenal glands, kidneys. Musculoskeletal: Presumably posttraumatic defects of right-sided ribs. IMPRESSION: 1. Status post right pneumonectomy. 2. Basilar and dependent  predominant airspace and ground-glass opacity throughout the left lung. Favor aspiration. Infection could look similar. 3. Dilated esophagus with debris throughout. This suggests severe dysmotility and/or gastroesophageal reflux disease. Patient is predisposed all high at to aspiration. 4. Cardiomegaly. Coronary artery atherosclerosis. Aortic atherosclerosis. 5. Moderate degradation, as above Electronically Signed   By: Abigail Miyamoto M.D.   On: 09/24/2016 17:36   Dg Chest Port 1 View  Result Date: 09/24/2016 CLINICAL DATA:  Initial evaluation for endotracheal tube adjustment. EXAM: PORTABLE CHEST 1 VIEW COMPARISON:  Prior studies from earlier same day. FINDINGS: Patient remains intubated with  the tip of the endotracheal tube positioned approximately 5 cm above the carina. Cardiomediastinal silhouette not well evaluated. Right hemithorax remains largely opacified. Patchy opacity and probable vascular congestion again noted within the left lung base. No pneumothorax. Osseous structures unchanged. IMPRESSION: 1. Tip of the endotracheal tube approximately 5 cm above the carina. 2. Otherwise little interval change in appearance of the chest. Electronically Signed   By: Jeannine Boga M.D.   On: 09/24/2016 18:17   Dg Chest Portable 1 View  Result Date: 09/24/2016 CLINICAL DATA:  Post intubation EXAM: PORTABLE CHEST 1 VIEW COMPARISON:  09/24/2016 FINDINGS: Endotracheal tube is 6 cm above the carina. Complete opacification of the right hemithorax from prior pneumonectomy. Patchy airspace disease in the left lower lobe. Cannot exclude pneumonia or aspiration. Heart is likely normal size. IMPRESSION: New patchy airspace disease in the left lower lobe concerning for pneumonia. Cannot exclude aspiration. Prior right pneumonectomy Electronically Signed   By: Rolm Baptise M.D.   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 Mediastinal shift to the right is stable. The left lung is clear and somewhat hyperaerated. Heart size is difficult to assess. No bony abnormality is seen. 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 The left lung is clear. Electronically Signed   By: Ivar Drape M.D.   On: 09/24/2016 14:49    IMPRESSION / PLAN:   1. Acute respiratory failure probably secondary to aspiration. Chicken became lodge in esophagus last night. CTscan shows dilated esophagus with debris throughout. Not clear if chicken passed so will need EGD with possible disimpaction today. The risks and benefits of EGD were discussed and the patient agrees to proceed.[place] Heparin discontinued   2. Chronic, intermittent solid food dysphagia. Previous EGD in  2014  - unusual cervical esophagus but scope passed okay. Most recent EGD in March of this year showed eccentric anatomy at vocal cords but no strictures.   Tye Savoy  09/25/2016, 9:35 AM Pager number [phone]    Attending physician's note   I have taken a history, examined the patient and reviewed the chart. I agree with the Advanced Practitioner's note, impression and recommendations. Chronic intermittent dysphagia now with acute resp failure, aspiration, intubated -episode started while swallowing chicken. CT shows a dilated esophagus with debris throughout. Pt has had small volume vomiting this morning. EGD in 12/2015 showed eccentric vocal cords and a tortuous esophagus. R/O persistent esophageal obstruction due to food impaction. EGD today. Consider esophageal manometry electively as outpatient.   Lucio Edward, MD [facility] [phone] Mon-Fri 8a-5p [phone] after 5p, weekends, holidays

## 2016-09-25 NOTE — Op Note (Addendum)
[facility] Emergency [facility] Patient Name: Erika Walker Procedure Date : 09/25/2016 MRN: [id number] Attending MD: Ladene Artist , MD Date of Birth: Dec 16, 1945 CSN: [id number] Age: 70 Admit Type: Inpatient Procedure:                Upper GI endoscopy Indications:              Dysphagia, Foreign body in the esophagus Providers:                Pricilla Riffle. Fuller Plan, MD Referring MD:             Merrie Roof, MD Medicines:                Fentanyl 25 micrograms IV, Midazolam 2 mg IV Complications:            No immediate complications. Estimated Blood Loss:     Estimated blood loss: none. Procedure:                Pre-Anesthesia Assessment:                           - Prior to the procedure, a History and Physical                            was performed, and patient medications and                            allergies were reviewed. The patient's tolerance of                            previous anesthesia was also reviewed. The risks                            and benefits of the procedure and the sedation                            options and risks were discussed with the patient.                            All questions were answered, and informed consent                            was obtained. Prior Anticoagulants: The patient has                            taken no previous anticoagulant or antiplatelet                            agents. ASA Grade Assessment: III - A patient with                            severe systemic disease. After reviewing the risks                            and benefits, the patient was deemed in  satisfactory condition to undergo the procedure.                           After obtaining informed consent, the endoscope was                            passed under direct vision. Throughout the                            procedure, the patient's blood pressure, pulse, and                            oxygen saturations were monitored  continuously. The                            Endoscope was introduced through the mouth, and                            advanced to the second part of duodenum. The upper                            GI endoscopy was accomplished without difficulty.                            The patient tolerated the procedure well. Technical                            difficulites with images so no images were                            available for report. Scope In: Scope Out: Findings:      One mild benign-appearing, intrinsic stenosis was found at the       gastroesophageal junction. This measured 1.3 cm (inner diameter) and was       traversed.      LA Grade B (one or more mucosal breaks greater than 5 mm, not extending       between the tops of two mucosal folds) esophagitis with no bleeding was       found in the distal esophagus.      The exam of the esophagus was otherwise normal.      A small hiatal hernia was present.      A medium amount of food (residue) was found in the gastric fundus and in       the gastric body.      The exam of the stomach was otherwise normal.      The duodenal bulb and second portion of the duodenum were normal. Impression:               - Benign-appearing esophageal stenosis.                           - LA Grade B reflux esophagitis.                           - Small hiatal hernia.                           -  A medium amount of food (residue) in the stomach.                           - Normal duodenal bulb and second portion of the                            duodenum.                           - No specimens collected. Moderate Sedation:      Moderate (conscious) sedation was administered by the endoscopy nurse       and supervised by the endoscopist. The following parameters were       monitored: oxygen saturation, heart rate, blood pressure, respiratory       rate, EKG, adequacy of pulmonary ventilation, and response to care.       Total physician intraservice  time was 10 minutes. Recommendation:           - Return patient to ICU for ongoing care.                           - Soft diet until outpatient GI follow up.                           - Continue present medications.                           - Protonix (pantoprazole) 40 mg PO BID until                            outpatient GI follow up.                           - Return to Dr. Ardis Hughs for consideration of                            esophageal dilation in 2-3 weeks.                           - GI signing off. Procedure Code(s):        --- Professional ---                           [phone], Esophagogastroduodenoscopy, flexible,                            transoral; diagnostic, including collection of                            specimen(s) by brushing or washing, when performed                            (separate procedure)                           99152, Moderate sedation services provided by the  same physician or other qualified health care                            professional performing the diagnostic or                            therapeutic service that the sedation supports,                            requiring the presence of an independent trained                            observer to assist in the monitoring of the                            patient's level of consciousness and physiological                            status; initial 15 minutes of intraservice time,                            patient age 67 years or older Diagnosis Code(s):        --- Professional ---                           K22.2, Esophageal obstruction                           K21.0, Gastro-esophageal reflux disease with                            esophagitis                           K44.9, Diaphragmatic hernia without obstruction or                            gangrene                           R13.10, Dysphagia, unspecified                           T18.108A, Unspecified  foreign body in esophagus                            causing other injury, initial encounter CPT copyright 2016 [employer]. All rights reserved. The codes documented in this report are preliminary and upon coder review may  be revised to meet current compliance requirements. Ladene Artist, MD 09/25/2016 2:47:56 PM This report has been signed electronically. Number of Addenda: 0

## 2016-09-25 NOTE — Consult Note (Signed)
Reason for Consult: Airway obstruction, respiratory distress Referring Physician: Raylene Miyamoto, MD  Erika Walker is an 70 y.o. female.  HPI: Remote history of tracheostomy about 25 years ago. Something happened yesterday that required urgent intubation. There is concerned about possible subglottic stenosis. Prior to this incident, she was in her usual state of health, and denies any history of stridor or respiratory distress. As far as she can recall, she did not have any physical limitations based on her breathing. She does have a history of pneumonectomy in 1992.  Past Medical History:  Diagnosis Date  . HLD (hyperlipidemia)     Past Surgical History:  Procedure Laterality Date  . COLONOSCOPY  2014   Dr Olevia Perches  . lung removed Right 1992   post trauma    Family History  Problem Relation Age of Onset  . Liver cancer Brother   . Diabetes Brother     pre-diabetic  . Colon cancer Neg Hx   . Pancreatic cancer Neg Hx   . Stomach cancer Neg Hx   . Stroke Neg Hx   . Heart disease Neg Hx   . Esophageal cancer Neg Hx     Social History:  reports that she has never smoked. She has never used smokeless tobacco. She reports that she does not drink alcohol or use drugs.  Allergies: No Known Allergies  Medications: Reviewed  Results for orders placed or performed during the hospital encounter of 09/24/16 (from the past 48 hour(s))  Basic metabolic panel     Status: Abnormal   Collection Time: 09/24/16  4:19 PM  Result Value Ref Range   Sodium 137 135 - 145 mmol/L   Potassium 3.3 (L) 3.5 - 5.1 mmol/L   Chloride 106 101 - 111 mmol/L   CO2 21 (L) 22 - 32 mmol/L   Glucose, Bld 247 (H) 65 - 99 mg/dL   BUN 19 6 - 20 mg/dL   Creatinine, Ser 1.18 (H) 0.44 - 1.00 mg/dL   Calcium 7.8 (L) 8.9 - 10.3 mg/dL   GFR calc non Af Amer 46 (L) >60 mL/min   GFR calc Af Amer 53 (L) >60 mL/min    Comment: (NOTE) The eGFR has been calculated using the CKD EPI equation. This calculation  has not been validated in all clinical situations. eGFR's persistently <60 mL/min signify possible Chronic Kidney Disease.    Anion gap 10 5 - 15  CBC     Status: Abnormal   Collection Time: 09/24/16  4:19 PM  Result Value Ref Range   WBC 18.9 (H) 4.0 - 10.5 K/uL   RBC 4.34 3.87 - 5.11 MIL/uL   Hemoglobin 13.7 12.0 - 15.0 g/dL   HCT 40.7 36.0 - 46.0 %   MCV 93.8 78.0 - 100.0 fL   MCH 31.6 26.0 - 34.0 pg   MCHC 33.7 30.0 - 36.0 g/dL   RDW 12.3 11.5 - 15.5 %   Platelets 251 150 - 400 K/uL  Lactic acid, plasma     Status: Abnormal   Collection Time: 09/24/16  4:19 PM  Result Value Ref Range   Lactic Acid, Venous 4.2 (HH) 0.5 - 1.9 mmol/L    Comment: CRITICAL RESULT CALLED TO, READ BACK BY AND VERIFIED WITH: GROSE,C RN @1720  BY GRINSTEAD,C 12.7.17   I-Stat arterial blood gas, ED     Status: Abnormal   Collection Time: 09/24/16  5:18 PM  Result Value Ref Range   pH, Arterial 7.244 (L) 7.350 - 7.450  pCO2 arterial 52.3 (H) 32.0 - 48.0 mmHg   pO2, Arterial 165.0 (H) 83.0 - 108.0 mmHg   Bicarbonate 22.8 20.0 - 28.0 mmol/L   TCO2 24 0 - 100 mmol/L   O2 Saturation 99.0 %   Acid-base deficit 5.0 (H) 0.0 - 2.0 mmol/L   Patient temperature 97.6 F    Collection site RADIAL, ALLEN'S TEST ACCEPTABLE    Drawn by RT    Sample type ARTERIAL   Culture, blood (routine x 2)     Status: None (Preliminary result)   Collection Time: 09/24/16  5:26 PM  Result Value Ref Range   Specimen Description BLOOD RIGHT HAND    Special Requests IN PEDIATRIC BOTTLE 2CC    Culture NO GROWTH < 24 HOURS    Report Status PENDING   Culture, respiratory (NON-Expectorated)     Status: None (Preliminary result)   Collection Time: 09/24/16  5:30 PM  Result Value Ref Range   Specimen Description BRONCHIAL ALVEOLAR LAVAGE    Special Requests NONE    Gram Stain      FEW WBC PRESENT, PREDOMINANTLY PMN FEW GRAM POSITIVE COCCI IN PAIRS RARE GRAM NEGATIVE COCCOBACILLI    Culture TOO YOUNG TO READ    Report Status  PENDING   Culture, blood (routine x 2)     Status: None (Preliminary result)   Collection Time: 09/24/16  5:33 PM  Result Value Ref Range   Specimen Description BLOOD LEFT HAND    Special Requests IN PEDIATRIC BOTTLE 2CC    Culture NO GROWTH < 24 HOURS    Report Status PENDING   I-Stat arterial blood gas, ED     Status: Abnormal   Collection Time: 09/24/16  6:35 PM  Result Value Ref Range   pH, Arterial 7.318 (L) 7.350 - 7.450   pCO2 arterial 36.6 32.0 - 48.0 mmHg   pO2, Arterial 91.0 83.0 - 108.0 mmHg   Bicarbonate 18.9 (L) 20.0 - 28.0 mmol/L   TCO2 20 0 - 100 mmol/L   O2 Saturation 97.0 %   Acid-base deficit 7.0 (H) 0.0 - 2.0 mmol/L   Patient temperature 97.6 F    Collection site RADIAL, ALLEN'S TEST ACCEPTABLE    Drawn by RT    Sample type ARTERIAL   MRSA PCR Screening     Status: None   Collection Time: 09/24/16  7:48 PM  Result Value Ref Range   MRSA by PCR NEGATIVE NEGATIVE    Comment:        The GeneXpert MRSA Assay (FDA approved for NASAL specimens only), is one component of a comprehensive MRSA colonization surveillance program. It is not intended to diagnose MRSA infection nor to guide or monitor treatment for MRSA infections.   Glucose, capillary     Status: Abnormal   Collection Time: 09/24/16  8:05 PM  Result Value Ref Range   Glucose-Capillary 180 (H) 65 - 99 mg/dL   Comment 1 Notify RN    Comment 2 Document in Chart   CBC     Status: Abnormal   Collection Time: 09/24/16  8:11 PM  Result Value Ref Range   WBC 14.3 (H) 4.0 - 10.5 K/uL   RBC 4.59 3.87 - 5.11 MIL/uL   Hemoglobin 14.5 12.0 - 15.0 g/dL   HCT 43.9 36.0 - 46.0 %   MCV 95.6 78.0 - 100.0 fL   MCH 31.6 26.0 - 34.0 pg   MCHC 33.0 30.0 - 36.0 g/dL   RDW 12.8 11.5 - 15.5 %  Platelets 213 150 - 400 K/uL  Creatinine, serum     Status: Abnormal   Collection Time: 09/24/16  8:11 PM  Result Value Ref Range   Creatinine, Ser 1.17 (H) 0.44 - 1.00 mg/dL   GFR calc non Af Amer 46 (L) >60 mL/min    GFR calc Af Amer 54 (L) >60 mL/min    Comment: (NOTE) The eGFR has been calculated using the CKD EPI equation. This calculation has not been validated in all clinical situations. eGFR's persistently <60 mL/min signify possible Chronic Kidney Disease.   Lactic acid, plasma     Status: Abnormal   Collection Time: 09/24/16  8:11 PM  Result Value Ref Range   Lactic Acid, Venous 6.0 (HH) 0.5 - 1.9 mmol/L    Comment: CRITICAL RESULT CALLED TO, READ BACK BY AND VERIFIED WITH: MOTLEY J,RN 09/24/16 2100 WAYK   Procalcitonin     Status: None   Collection Time: 09/24/16  8:11 PM  Result Value Ref Range   Procalcitonin 0.78 ng/mL    Comment:        Interpretation: PCT > 0.5 ng/mL and <= 2 ng/mL: Systemic infection (sepsis) is possible, but other conditions are known to elevate PCT as well. (NOTE)         ICU PCT Algorithm               Non ICU PCT Algorithm    ----------------------------     ------------------------------         PCT < 0.25 ng/mL                 PCT < 0.1 ng/mL     Stopping of antibiotics            Stopping of antibiotics       strongly encouraged.               strongly encouraged.    ----------------------------     ------------------------------       PCT level decrease by               PCT < 0.25 ng/mL       >= 80% from peak PCT       OR PCT 0.25 - 0.5 ng/mL          Stopping of antibiotics                                             encouraged.     Stopping of antibiotics           encouraged.    ----------------------------     ------------------------------       PCT level decrease by              PCT >= 0.25 ng/mL       < 80% from peak PCT        AND PCT >= 0.5 ng/mL             Continuing antibiotics                                              encouraged.       Continuing antibiotics            encouraged.    ----------------------------     ------------------------------  PCT level increase compared          PCT > 0.5 ng/mL         with peak PCT  AND          PCT >= 0.5 ng/mL             Escalation of antibiotics                                          strongly encouraged.      Escalation of antibiotics        strongly encouraged.   Cortisol     Status: None   Collection Time: 09/24/16  8:11 PM  Result Value Ref Range   Cortisol, Plasma 19.3 ug/dL    Comment: (NOTE) AM    6.7 - 22.6 ug/dL PM   <10.0       ug/dL   Urinalysis, Routine w reflex microscopic     Status: Abnormal   Collection Time: 09/24/16  8:27 PM  Result Value Ref Range   Color, Urine STRAW (A) YELLOW   APPearance CLEAR CLEAR   Specific Gravity, Urine 1.014 1.005 - 1.030   pH 5.0 5.0 - 8.0   Glucose, UA 150 (A) NEGATIVE mg/dL   Hgb urine dipstick SMALL (A) NEGATIVE   Bilirubin Urine NEGATIVE NEGATIVE   Ketones, ur NEGATIVE NEGATIVE mg/dL   Protein, ur NEGATIVE NEGATIVE mg/dL   Nitrite NEGATIVE NEGATIVE   Leukocytes, UA NEGATIVE NEGATIVE   RBC / HPF 0-5 0 - 5 RBC/hpf   WBC, UA 0-5 0 - 5 WBC/hpf   Bacteria, UA NONE SEEN NONE SEEN   Squamous Epithelial / LPF 0-5 (A) NONE SEEN   Mucous PRESENT    Hyaline Casts, UA PRESENT   Lactic acid, plasma     Status: Abnormal   Collection Time: 09/24/16 11:01 PM  Result Value Ref Range   Lactic Acid, Venous 5.0 (HH) 0.5 - 1.9 mmol/L    Comment: CRITICAL RESULT CALLED TO, READ BACK BY AND VERIFIED WITH: MOTLEY J,RN 09/24/16 2342 WAYK   Glucose, capillary     Status: Abnormal   Collection Time: 09/24/16 11:31 PM  Result Value Ref Range   Glucose-Capillary 146 (H) 65 - 99 mg/dL   Comment 1 Notify RN    Comment 2 Document in Chart   Basic metabolic panel     Status: Abnormal   Collection Time: 09/25/16  2:21 AM  Result Value Ref Range   Sodium 138 135 - 145 mmol/L   Potassium 4.8 3.5 - 5.1 mmol/L    Comment: DELTA CHECK NOTED   Chloride 110 101 - 111 mmol/L   CO2 17 (L) 22 - 32 mmol/L   Glucose, Bld 153 (H) 65 - 99 mg/dL   BUN 15 6 - 20 mg/dL   Creatinine, Ser 1.00 0.44 - 1.00 mg/dL   Calcium 7.9 (L) 8.9 -  10.3 mg/dL   GFR calc non Af Amer 56 (L) >60 mL/min   GFR calc Af Amer >60 >60 mL/min    Comment: (NOTE) The eGFR has been calculated using the CKD EPI equation. This calculation has not been validated in all clinical situations. eGFR's persistently <60 mL/min signify possible Chronic Kidney Disease.    Anion gap 11 5 - 15  CBC     Status: Abnormal   Collection Time: 09/25/16  2:21 AM  Result Value  Ref Range   WBC 11.9 (H) 4.0 - 10.5 K/uL   RBC 4.09 3.87 - 5.11 MIL/uL   Hemoglobin 13.0 12.0 - 15.0 g/dL   HCT 38.1 36.0 - 46.0 %   MCV 93.2 78.0 - 100.0 fL   MCH 31.8 26.0 - 34.0 pg   MCHC 34.1 30.0 - 36.0 g/dL   RDW 12.5 11.5 - 15.5 %   Platelets 188 150 - 400 K/uL  Glucose, capillary     Status: Abnormal   Collection Time: 09/25/16  3:38 AM  Result Value Ref Range   Glucose-Capillary 152 (H) 65 - 99 mg/dL   Comment 1 Notify RN    Comment 2 Document in Chart   Blood gas, arterial     Status: Abnormal   Collection Time: 09/25/16  4:00 AM  Result Value Ref Range   FIO2 40.00    Delivery systems VENTILATOR    Mode PRESSURE REGULATED VOLUME CONTROL    VT 300 mL   LHR 300 resp/min   Peep/cpap 5.0 cm H20   pH, Arterial 7.428 7.350 - 7.450   pCO2 arterial 29.1 (L) 32.0 - 48.0 mmHg   pO2, Arterial 66.4 (L) 83.0 - 108.0 mmHg   Bicarbonate 18.9 (L) 20.0 - 28.0 mmol/L   Acid-base deficit 4.7 (H) 0.0 - 2.0 mmol/L   O2 Saturation 94.1 %   Patient temperature 98.6    Collection site RIGHT RADIAL    Drawn by [phone]    Sample type ARTERIAL DRAW    Allens test (pass/fail) PASS PASS  Glucose, capillary     Status: Abnormal   Collection Time: 09/25/16  7:58 AM  Result Value Ref Range   Glucose-Capillary 136 (H) 65 - 99 mg/dL   Comment 1 Notify RN    Comment 2 Document in Chart   Glucose, capillary     Status: Abnormal   Collection Time: 09/25/16 11:50 AM  Result Value Ref Range   Glucose-Capillary 117 (H) 65 - 99 mg/dL   Comment 1 Notify RN    Comment 2 Document in Chart     Ct  Soft Tissue Neck Wo Contrast  Result Date: 09/24/2016 CLINICAL DATA:  Difficulty breathing. Question subglottic stenosis. Prior pneumonectomy. Patient choked on chicken bone. EXAM: CT NECK WITHOUT CONTRAST TECHNIQUE: Multidetector CT imaging of the neck was performed following the standard protocol without intravenous contrast. COMPARISON:  CT chest today.  Chest x-ray today. FINDINGS: Pharynx and larynx: Endotracheal tube passes lateral to the tongue on the left displacing the tongue to the right. No tongue mass is identified. Endotracheal tube is in good position in the mid trachea. The balloon is inflated. No subglottic stenosis identified. No retained foreign body air chicken bone is present. The esophagus is dilated and filled with food and air-fluid level. The esophagus is present to the right of midline related to prior pneumonectomy. Salivary glands: Negative Thyroid: Negative Lymph nodes: No enlarged or pathologic lymph nodes. Vascular: Vascular patency not evaluated without intravenous contrast. Limited intracranial: Negative Visualized orbits: Negative Mastoids and visualized paranasal sinuses: Mild mucosal edema left maxillary sinus. Remaining sinuses clear. Skeleton: Cervical disc degeneration and mild spurring. No acute skeletal abnormality. Upper chest: Right pneumonectomy. Dilated esophagus with air-fluid level and retained food. Mild patchy infiltrate left upper lobe. Chest CT reported separately Other: None IMPRESSION: Endotracheal tube in the midtrachea.  No subglottic stenosis. No retained foreign body or chicken bone. Dilated esophagus with air-fluid level in retained food. Negative for mass lesion. Electronically Signed  By: Franchot Gallo M.D.   On: 09/24/2016 17:23   Ct Chest Wo Contrast  Result Date: 09/24/2016 CLINICAL DATA:  Difficulty breathing. Question subglottic stenosis. Prior pneumonectomy. Possible choking. EXAM: CT CHEST WITHOUT CONTRAST TECHNIQUE: Multidetector CT imaging  of the chest was performed following the standard protocol without IV contrast. COMPARISON:  Plain films of earlier today.  No prior CT. FINDINGS: Cardiovascular: Aortic and branch vessel atherosclerosis. Mediastinal shift to the right. Cardiomegaly. No pericardial effusion. probable left main coronary artery atherosclerosis. Right coronary artery atherosclerosis. Mediastinum/Nodes: No supraclavicular adenopathy. limited evaluation for thoracic adenopathy secondary to anatomic distortion and lack of IV contrast. No gross adenopathy identified. The esophagus is dilated and debris filled throughout. This extends to the level of the thoracic inlet, including on image 33/series 3. Lungs/Pleura: No pleural fluid. Status post right-sided pneumonectomy, without air in the pneumonectomy bed. Degradation secondary to patient arm position and mild motion. No other radiopaque foreign body identified within the trachea. Hyperexpansion of the left lung. Dependent lower lobe predominant patchy airspace and ground-glass opacity with septal thickening. Upper Abdomen: Degradation continuing into the upper abdomen, including secondary to EKG lead artifact. Grossly normal noncontrast appearance of the liver, spleen, stomach, adrenal glands, kidneys. Musculoskeletal: Presumably posttraumatic defects of right-sided ribs. IMPRESSION: 1. Status post right pneumonectomy. 2. Basilar and dependent predominant airspace and ground-glass opacity throughout the left lung. Favor aspiration. Infection could look similar. 3. Dilated esophagus with debris throughout. This suggests severe dysmotility and/or gastroesophageal reflux disease. Patient is predisposed all high at to aspiration. 4. Cardiomegaly. Coronary artery atherosclerosis. Aortic atherosclerosis. 5. Moderate degradation, as above Electronically Signed   By: Abigail Miyamoto M.D.   On: 09/24/2016 17:36   Dg Chest Port 1 View  Result Date: 09/24/2016 CLINICAL DATA:  Initial evaluation  for endotracheal tube adjustment. EXAM: PORTABLE CHEST 1 VIEW COMPARISON:  Prior studies from earlier same day. FINDINGS: Patient remains intubated with the tip of the endotracheal tube positioned approximately 5 cm above the carina. Cardiomediastinal silhouette not well evaluated. Right hemithorax remains largely opacified. Patchy opacity and probable vascular congestion again noted within the left lung base. No pneumothorax. Osseous structures unchanged. IMPRESSION: 1. Tip of the endotracheal tube approximately 5 cm above the carina. 2. Otherwise little interval change in appearance of the chest. Electronically Signed   By: Jeannine Boga M.D.   On: 09/24/2016 18:17   Dg Chest Portable 1 View  Result Date: 09/24/2016 CLINICAL DATA:  Post intubation EXAM: PORTABLE CHEST 1 VIEW COMPARISON:  09/24/2016 FINDINGS: Endotracheal tube is 6 cm above the carina. Complete opacification of the right hemithorax from prior pneumonectomy. Patchy airspace disease in the left lower lobe. Cannot exclude pneumonia or aspiration. Heart is likely normal size. IMPRESSION: New patchy airspace disease in the left lower lobe concerning for pneumonia. Cannot exclude aspiration. Prior right pneumonectomy Electronically Signed   By: Rolm Baptise M.D.   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 Mediastinal shift to the right is stable. The left lung is clear and somewhat hyperaerated. Heart size is difficult to assess. No bony abnormality is seen. 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 The left lung is clear. Electronically Signed  By: Ivar Drape M.D.   On: 09/24/2016  14:49    [id number] except as listed in admit H&P  Blood pressure (!) 148/78, pulse 70, temperature 98.9 F (37.2 C), temperature source Oral, resp. rate 20, height 5' 6"  (1.676 m), weight 74.3 kg (163 lb 12.8 oz), SpO2 100 %.  PHYSICAL EXAM: Overall appearance:  Healthy appearing, in no distress, awake and able to answer questions by writing. Head:  Normocephalic, atraumatic. Ears: External ears look healthy. Nose: External nose is healthy in appearance. Internal nasal exam free of any lesions or obstruction. Oral Cavity/Pharynx:  There are no mucosal lesions or masses identified. There is an oral tracheal tube in place. Larynx/Hypopharynx: Deferred Neuro:  No identifiable neurologic deficits. Neck: No palpable neck masses. Old tracheostomy scar.  Studies Reviewed: none  Procedures: none   Assessment/Plan: Recommend controlled extubation in the operating room with microlaryngoscopy and bronchoscopy to evaluate the airway down to the subglottic region. We discussed that it's possible that we will simply be able to extubate and not do anything else. We also discussed that it's possible she may need a tracheostomy. Other possible procedures could involve dilating the subglottic airway.  Sencere Symonette 09/25/2016, 3:13 PM

## 2016-09-25 NOTE — Progress Notes (Signed)
PULMONARY / CRITICAL CARE MEDICINE   Name: Erika Walker MRN: [id number] DOB: 01-07-1946    ADMISSION DATE:  09/24/2016 CONSULTATION DATE:  12/7  REFERRING MD:  Dr Venora Maples  CHIEF COMPLAINT:  Acute resp failure, aspiration, chocking episode on chicken  HISTORY OF PRESENT ILLNESS:  70 yr old history chest truama, with trach 25 years ago requiring RT pneumonectomy and trach then and history esoph dilation, myltiple er viists for chokcing who was found again choking which required resuscitation by EMS including positive pressure bag mask ventilation after they found the patient to be apneic and blue.  She was eating chicken whehn this occurred.  She has had dysphagia before and has had esophageal dilation 1.  EMS report that when they suctioned her a large piece of thickened mucus came out of her throat.  EDP reported inspiratory stridor. Requird emergent intubation. Cords wnl, intially 7.5 attempted then rmeoved as hit some subglottic obstruction. ETT changed to 6.5.  Called for admission.  SUBJECTIVE: No acute events since admission. Patient more awake today and appears comfortable. Denies any pain, difficulty breathing, or nausea writing notes.    REVIEW OF SYSTEMS:  Unable to obtain given intubation and sedation.  VITAL SIGNS: BP (!) 145/74   Pulse 81   Temp 99.3 F (37.4 C) (Oral)   Resp (!) 23   Ht 5\' 6"  (1.676 m)   Wt 163 lb 12.8 oz (74.3 kg)   SpO2 100%   BMI 26.44 kg/m   HEMODYNAMICS:    VENTILATOR SETTINGS: Vent Mode: PRVC FiO2 (%):  [40 %-100 %] 40 % Set Rate:  [18 bmp-30 bmp] 30 bmp Vt Set:  [300 mL-350 mL] 300 mL PEEP:  [5 cmH20] 5 cmH20 Plateau Pressure:  [12 cmH20-19 cmH20] 18 cmH20  INTAKE / OUTPUT: I/O last 3 completed shifts: In: 1770.1 [I.V.:970.1; IV Piggyback:800] Out: 700 [Urine:700]  PHYSICAL EXAMINATION: General:  On ventilator. No family at bedside. Awake. Neuro:  Moving all 4 extremities. No meningismus. Nods appropriately & writing notes.   HEENT:  Endotracheal tube in place. No scleral icterus or injection. Cardiovascular:  Regular rate. No edema. No JVD. Pulmonary:  Diminished breath sounds on the right. Overall clear on auscultation on ventilator.  Abdomen:  Soft. Nondistended. Normal bowel sounds. Integument:  Warm & dry. No rash on exposed skin.   LABS:  BMET  Recent Labs Lab 09/24/16 1619 09/24/16 2011 09/25/16 0221  NA 137  --  138  K 3.3*  --  4.8  CL 106  --  110  CO2 21*  --  17*  BUN 19  --  15  CREATININE 1.18* 1.17* 1.00  GLUCOSE 247*  --  153*    Electrolytes  Recent Labs Lab 09/24/16 1619 09/25/16 0221  CALCIUM 7.8* 7.9*    CBC  Recent Labs Lab 09/24/16 1619 09/24/16 2011 09/25/16 0221  WBC 18.9* 14.3* 11.9*  HGB 13.7 14.5 13.0  HCT 40.7 43.9 38.1  PLT 251 213 188    Coag's No results for input(s): APTT, INR in the last 168 hours.  Sepsis Markers  Recent Labs Lab 09/24/16 1619 09/24/16 2011 09/24/16 2301  LATICACIDVEN 4.2* 6.0* 5.0*  PROCALCITON  --  0.78  --     ABG  Recent Labs Lab 09/24/16 1718 09/24/16 1835 09/25/16 0400  PHART 7.244* 7.318* 7.428  PCO2ART 52.3* 36.6 29.1*  PO2ART 165.0* 91.0 66.4*    Liver Enzymes No results for input(s): AST, ALT, ALKPHOS, BILITOT, ALBUMIN in the last 168 hours.  Cardiac Enzymes No results for input(s): TROPONINI, PROBNP in the last 168 hours.  Glucose  Recent Labs Lab 09/24/16 2005 09/24/16 2331 09/25/16 0338 09/25/16 0758  GLUCAP 180* 146* 152* 136*    Imaging Ct Soft Tissue Neck Wo Contrast  Result Date: 09/24/2016 CLINICAL DATA:  Difficulty breathing. Question subglottic stenosis. Prior pneumonectomy. Patient choked on chicken bone. EXAM: CT NECK WITHOUT CONTRAST TECHNIQUE: Multidetector CT imaging of the neck was performed following the standard protocol without intravenous contrast. COMPARISON:  CT chest today.  Chest x-ray today. FINDINGS: Pharynx and larynx: Endotracheal tube passes lateral to the  tongue on the left displacing the tongue to the right. No tongue mass is identified. Endotracheal tube is in good position in the mid trachea. The balloon is inflated. No subglottic stenosis identified. No retained foreign body air chicken bone is present. The esophagus is dilated and filled with food and air-fluid level. The esophagus is present to the right of midline related to prior pneumonectomy. Salivary glands: Negative Thyroid: Negative Lymph nodes: No enlarged or pathologic lymph nodes. Vascular: Vascular patency not evaluated without intravenous contrast. Limited intracranial: Negative Visualized orbits: Negative Mastoids and visualized paranasal sinuses: Mild mucosal edema left maxillary sinus. Remaining sinuses clear. Skeleton: Cervical disc degeneration and mild spurring. No acute skeletal abnormality. Upper chest: Right pneumonectomy. Dilated esophagus with air-fluid level and retained food. Mild patchy infiltrate left upper lobe. Chest CT reported separately Other: None IMPRESSION: Endotracheal tube in the midtrachea.  No subglottic stenosis. No retained foreign body or chicken bone. Dilated esophagus with air-fluid level in retained food. Negative for mass lesion. Electronically Signed   By: Franchot Gallo M.D.   On: 09/24/2016 17:23   Ct Chest Wo Contrast  Result Date: 09/24/2016 CLINICAL DATA:  Difficulty breathing. Question subglottic stenosis. Prior pneumonectomy. Possible choking. EXAM: CT CHEST WITHOUT CONTRAST TECHNIQUE: Multidetector CT imaging of the chest was performed following the standard protocol without IV contrast. COMPARISON:  Plain films of earlier today.  No prior CT. FINDINGS: Cardiovascular: Aortic and branch vessel atherosclerosis. Mediastinal shift to the right. Cardiomegaly. No pericardial effusion. probable left main coronary artery atherosclerosis. Right coronary artery atherosclerosis. Mediastinum/Nodes: No supraclavicular adenopathy. limited evaluation for thoracic  adenopathy secondary to anatomic distortion and lack of IV contrast. No gross adenopathy identified. The esophagus is dilated and debris filled throughout. This extends to the level of the thoracic inlet, including on image 33/series 3. Lungs/Pleura: No pleural fluid. Status post right-sided pneumonectomy, without air in the pneumonectomy bed. Degradation secondary to patient arm position and mild motion. No other radiopaque foreign body identified within the trachea. Hyperexpansion of the left lung. Dependent lower lobe predominant patchy airspace and ground-glass opacity with septal thickening. Upper Abdomen: Degradation continuing into the upper abdomen, including secondary to EKG lead artifact. Grossly normal noncontrast appearance of the liver, spleen, stomach, adrenal glands, kidneys. Musculoskeletal: Presumably posttraumatic defects of right-sided ribs. IMPRESSION: 1. Status post right pneumonectomy. 2. Basilar and dependent predominant airspace and ground-glass opacity throughout the left lung. Favor aspiration. Infection could look similar. 3. Dilated esophagus with debris throughout. This suggests severe dysmotility and/or gastroesophageal reflux disease. Patient is predisposed all high at to aspiration. 4. Cardiomegaly. Coronary artery atherosclerosis. Aortic atherosclerosis. 5. Moderate degradation, as above Electronically Signed   By: Abigail Miyamoto M.D.   On: 09/24/2016 17:36   Dg Chest Port 1 View  Result Date: 09/24/2016 CLINICAL DATA:  Initial evaluation for endotracheal tube adjustment. EXAM: PORTABLE CHEST 1 VIEW COMPARISON:  Prior studies from earlier same  day. FINDINGS: Patient remains intubated with the tip of the endotracheal tube positioned approximately 5 cm above the carina. Cardiomediastinal silhouette not well evaluated. Right hemithorax remains largely opacified. Patchy opacity and probable vascular congestion again noted within the left lung base. No pneumothorax. Osseous structures  unchanged. IMPRESSION: 1. Tip of the endotracheal tube approximately 5 cm above the carina. 2. Otherwise little interval change in appearance of the chest. Electronically Signed   By: Jeannine Boga M.D.   On: 09/24/2016 18:17   Dg Chest Portable 1 View  Result Date: 09/24/2016 CLINICAL DATA:  Post intubation EXAM: PORTABLE CHEST 1 VIEW COMPARISON:  09/24/2016 FINDINGS: Endotracheal tube is 6 cm above the carina. Complete opacification of the right hemithorax from prior pneumonectomy. Patchy airspace disease in the left lower lobe. Cannot exclude pneumonia or aspiration. Heart is likely normal size. IMPRESSION: New patchy airspace disease in the left lower lobe concerning for pneumonia. Cannot exclude aspiration. Prior right pneumonectomy Electronically Signed   By: Rolm Baptise M.D.   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 Mediastinal shift to the right is stable. The left lung is clear and somewhat hyperaerated. Heart size is difficult to assess. No bony abnormality is seen. 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 The left lung is clear. Electronically Signed   By: Ivar Drape M.D.   On: 09/24/2016 14:49     STUDIES:  CT Neck/Soft Tissue 12/7: IMPRESSION: Endotracheal tube in the midtrachea.  No subglottic stenosis.  No retained foreign body or chicken bone. Dilated esophagus with air-fluid level in retained food.  Negative for mass lesion.  CT Chest W/O 12/7: IMPRESSION: 1. Status post right pneumonectomy. 2. Basilar and dependent predominant airspace and ground-glass opacity throughout the left lung. Favor aspiration.  Infection could look similar. 3. Dilated esophagus with debris throughout. This suggests severe dysmotility and/or gastroesophageal reflux disease. Patient is predisposed all high at to aspiration. 4. Cardiomegaly. Coronary artery atherosclerosis. Aortic atherosclerosis.  MICROBIOLOGY: MRSA PCR 12/7:  Negative Blood Ctx x2 12/7 >> BAL LLL 12/7 >>  ANTIBIOTICS: Vancomycin 12/7 - 12/8 Unasyn 12/7 >>  SIGNIFICANT EVENTS: 12/07 - choked on chicken, obstruction airway, small ETT plaed, Bronch  LINES/TUBES: OETT 6.5 12/7 >> Foley 12/7 >> PIV x2  ASSESSMENT / PLAN:  PULMONARY A: Acute Hypoxic Respiratory Failure - Secondary to aspiration while choking on food. Probable/Possible Subglottic Obstruction - Prior trach in 1990. Left Lung Aspiration Pneumonia vs Pneumonitis - S/P Bronch 12/7. S/P Right Pneumonectomy  P:   Full Vent Support Intermittent ABG & Portable CXR Consulting ENT for extubation in OR when stable  CARDIOVASCULAR A:  Hypotension - Secondary to sedation in ED. Resolved. H/O Hyperlipidemia - Not on home meds.  P:  Continuous telemetry monitoring Vitals per unit protocol Goal MAP >65  RENAL A:   Hypokalemia - Resolved. Lactic Acidosis - Improving. NAGMA - Likely due to lactic acid.  P:   Trending UOP Monitoring electrolytes & renal function daily Replacing electrolytes as indicated Repeat Lactic Acid in AM  GASTROINTESTINAL A:   Food Impaction H/O Esophageal Stricture - Prior dilation.  P:   NPO GI Consulted for Probable EGD Holding on OGT placement & tube  feedings Pepcid IV q12hr Speech Evaluation once extubated  HEMATOLOGIC A:   Leukocytosis - Likely reactive.  P:  Trending cell counts daily w/ CBC SCDs Heparin [place] q8hr  INFECTIOUS A:   Aspiration Pneumonia vs Pneumonitis  P:   D/C Vancomycin Continuing Empiric Unasyn Day #2 Awaiting Culture Results Trending Protcalcitonin per algorithm  ENDOCRINE A:   Hyperglycemia -  Likely secondary to Decadron.  P:   Checking Hgb A1c w/ AM Labs SSI per Sensitive Algorithm Accu-Checks q4hr  NEUROLOGIC A:   Sedation on Ventilator  P:   RASS goal: 0 to -1 Fentanyl IV drip & prn Pain Versed IV prn Sedation   FAMILY  - Updates: No family at bedside 12/8. Patient updated 12/8 by Dr. Ashok Cordia at [place] family meet or Palliative Care meeting due by:  12/14  TODAY'S SUMMARY:  70 y.o. female with aspiration and prior esophageal stricture requiring dilation. Treating for aspiration pneumonia with Unasyn only. Consulting ENT for possible extubation in the OR given the potential for subglottic stenosis.   I have personally spent a total of 34 minutes of critical care time today caring for the patient and reviewing the patient's electronic medical record.  Sonia Baller Ashok Cordia, M.D. [facility] Pager:  [phone] After 3pm or if no response, call [phone] 09/25/2016, 10:58 AM

## 2016-09-25 NOTE — Care Management Note (Signed)
Case Management Note  Patient Details  Name: Erika Walker MRN: [id number] Date of Birth: [birth year]  Subjective/Objective:        Pt admitted post choking episode.  Pt is now on ventilator            Action/Plan:  PTA from home. Pt has hx of deviated esophagus and will need to be extubated in OR.   CM will continue to follow for discharge needs.   Expected Discharge Date:                  Expected Discharge Plan:     In-House Referral:     Discharge planning Services  CM Consult  Post Acute Care Choice:    Choice offered to:     DME Arranged:    DME Agency:     HH Arranged:    HH Agency:     Status of Service:  In process, will continue to follow  If discussed at Long Length of Stay Meetings, dates discussed:    Additional Comments:  Maryclare Labrador, RN 09/25/2016, 2:59 PM

## 2016-09-26 ENCOUNTER — Encounter (HOSPITAL_COMMUNITY)
Admission: EM | Disposition: A | Discharge status: Home / Self Care | Payer: Self-pay | Source: Home / Self Care | Attending: Internal Medicine

## 2016-09-26 ENCOUNTER — Inpatient Hospital Stay (HOSPITAL_COMMUNITY): Payer: Medicare Other | Admitting: Anesthesiology

## 2016-09-26 DIAGNOSIS — K210 Gastro-esophageal reflux disease with esophagitis: Secondary | ICD-10-CM

## 2016-09-26 DIAGNOSIS — K222 Esophageal obstruction: Secondary | ICD-10-CM

## 2016-09-26 HISTORY — PX: LARYNGOSCOPY AND BRONCHOSCOPY: SHX5659

## 2016-09-26 LAB — HEPATIC FUNCTION PANEL
ALK PHOS: 52 U/L (ref 38–126)
ALT: 33 U/L (ref 14–54)
AST: 30 U/L (ref 15–41)
Albumin: 2.8 g/dL — ABNORMAL LOW (ref 3.5–5.0)
BILIRUBIN TOTAL: 0.8 mg/dL (ref 0.3–1.2)
Bilirubin, Direct: <0.1 mg/dL — ABNORMAL LOW (ref 0.1–0.5)
Total Protein: 5.2 g/dL — ABNORMAL LOW (ref 6.5–8.1)

## 2016-09-26 LAB — GLUCOSE, CAPILLARY
GLUCOSE-CAPILLARY: 105 mg/dL — AB (ref 65–99)
GLUCOSE-CAPILLARY: 106 mg/dL — AB (ref 65–99)
GLUCOSE-CAPILLARY: 92 mg/dL (ref 65–99)
Glucose-Capillary: 113 mg/dL — ABNORMAL HIGH (ref 65–99)
Glucose-Capillary: 112 mg/dL — ABNORMAL HIGH (ref 65–99)
Glucose-Capillary: 102 mg/dL — ABNORMAL HIGH (ref 65–99)

## 2016-09-26 LAB — RENAL FUNCTION PANEL
Albumin: 2.8 g/dL — ABNORMAL LOW (ref 3.5–5.0)
Anion gap: 8 (ref 5–15)
BUN: 14 mg/dL (ref 6–20)
CHLORIDE: 108 mmol/L (ref 101–111)
CO2: 21 mmol/L — AB (ref 22–32)
Calcium: 8 mg/dL — ABNORMAL LOW (ref 8.9–10.3)
Creatinine, Ser: 0.97 mg/dL (ref 0.44–1.00)
GFR calc Af Amer: >60 mL/min (ref >60)
GFR calc non Af Amer: 58 mL/min — ABNORMAL LOW (ref >60)
GLUCOSE: 113 mg/dL — AB (ref 65–99)
POTASSIUM: 3.7 mmol/L (ref 3.5–5.1)
Phosphorus: 2.2 mg/dL — ABNORMAL LOW (ref 2.5–4.6)
Sodium: 137 mmol/L (ref 135–145)

## 2016-09-26 LAB — CBC WITH DIFFERENTIAL/PLATELET
Basophils Absolute: 0 10*3/uL (ref 0.0–0.1)
Basophils Relative: 0 %
EOS PCT: 0 %
Eosinophils Absolute: 0 10*3/uL (ref 0.0–0.7)
HCT: 36.1 % (ref 36.0–46.0)
Hemoglobin: 12 g/dL (ref 12.0–15.0)
LYMPHS ABS: 1.8 10*3/uL (ref 0.7–4.0)
LYMPHS PCT: 15 %
MCH: 31.3 pg (ref 26.0–34.0)
MCHC: 33.2 g/dL (ref 30.0–36.0)
MCV: 94.3 fL (ref 78.0–100.0)
MONO ABS: 0.7 10*3/uL (ref 0.1–1.0)
MONOS PCT: 6 %
Neutro Abs: 9.6 10*3/uL — ABNORMAL HIGH (ref 1.7–7.7)
Neutrophils Relative %: 79 %
PLATELETS: 168 10*3/uL (ref 150–400)
RBC: 3.83 MIL/uL — AB (ref 3.87–5.11)
RDW: 12.8 % (ref 11.5–15.5)
WBC: 12.1 10*3/uL — ABNORMAL HIGH (ref 4.0–10.5)

## 2016-09-26 LAB — LACTIC ACID, PLASMA: Lactic Acid, Venous: 1.7 mmol/L (ref 0.5–1.9)

## 2016-09-26 LAB — MAGNESIUM: Magnesium: 2 mg/dL (ref 1.7–2.4)

## 2016-09-26 SURGERY — LARYNGOSCOPY, WITH BRONCHOSCOPY
Anesthesia: General

## 2016-09-26 MED ORDER — ONDANSETRON HCL 4 MG/2ML IJ SOLN
INTRAMUSCULAR | Status: AC
  Filled 2016-09-26: qty 2

## 2016-09-26 MED ORDER — ROCURONIUM BROMIDE 100 MG/10ML IV SOLN
INTRAVENOUS | Status: DC | PRN
  Administered 2016-09-26: 40 mg via INTRAVENOUS

## 2016-09-26 MED ORDER — OXYCODONE HCL 5 MG/5ML PO SOLN: 5.0000 mg | Freq: Once | ORAL | Status: DC | PRN

## 2016-09-26 MED ORDER — PROPOFOL 10 MG/ML IV BOLUS
INTRAVENOUS | Status: AC
  Filled 2016-09-26: qty 20

## 2016-09-26 MED ORDER — ARTIFICIAL TEARS OP OINT
TOPICAL_OINTMENT | OPHTHALMIC | Status: DC | PRN
  Administered 2016-09-26: 1 via OPHTHALMIC

## 2016-09-26 MED ORDER — FENTANYL CITRATE (PF) 100 MCG/2ML IJ SOLN: 25.0000 ug | INTRAMUSCULAR | Status: DC | PRN

## 2016-09-26 MED ORDER — PROPOFOL 10 MG/ML IV BOLUS
INTRAVENOUS | Status: DC | PRN
  Administered 2016-09-26: 70 mg via INTRAVENOUS

## 2016-09-26 MED ORDER — OXYCODONE HCL 5 MG PO TABS: 5.0000 mg | ORAL_TABLET | Freq: Once | ORAL | Status: DC | PRN

## 2016-09-26 MED ORDER — LIDOCAINE 2% (20 MG/ML) 5 ML SYRINGE
INTRAMUSCULAR | Status: AC
  Filled 2016-09-26: qty 5

## 2016-09-26 MED ORDER — SUCCINYLCHOLINE CHLORIDE 200 MG/10ML IV SOSY
PREFILLED_SYRINGE | INTRAVENOUS | Status: AC
  Filled 2016-09-26: qty 10

## 2016-09-26 MED ORDER — SUGAMMADEX SODIUM 200 MG/2ML IV SOLN
INTRAVENOUS | Status: DC | PRN
  Administered 2016-09-26: 308 mg via INTRAVENOUS

## 2016-09-26 MED ORDER — FENTANYL CITRATE (PF) 100 MCG/2ML IJ SOLN
INTRAMUSCULAR | Status: AC
  Filled 2016-09-26: qty 2

## 2016-09-26 MED ORDER — LACTATED RINGERS IV SOLN
INTRAVENOUS | Status: DC | PRN
  Administered 2016-09-26: 13:00:00 via INTRAVENOUS

## 2016-09-26 MED ORDER — ROCURONIUM BROMIDE 10 MG/ML (PF) SYRINGE
PREFILLED_SYRINGE | INTRAVENOUS | Status: AC
  Filled 2016-09-26: qty 10

## 2016-09-26 MED ORDER — ONDANSETRON HCL 4 MG/2ML IJ SOLN: 4.0000 mg | Freq: Once | INTRAMUSCULAR | Status: DC | PRN

## 2016-09-26 MED ORDER — SUGAMMADEX SODIUM 200 MG/2ML IV SOLN
INTRAVENOUS | Status: AC
  Filled 2016-09-26: qty 2

## 2016-09-26 MED ORDER — MIDAZOLAM HCL 2 MG/2ML IJ SOLN
INTRAMUSCULAR | Status: AC
  Filled 2016-09-26: qty 2

## 2016-09-26 SURGICAL SUPPLY — 30 items
BALLN PULM 15 16.5 18 X 75CM (BALLOONS)
BALLN PULM 15 16.5 18X75 (BALLOONS)
BALLOON PULM 15 16.5 18X75 (BALLOONS) IMPLANT
BLADE SURG 15 STRL LF DISP TIS (BLADE) IMPLANT
BLADE SURG 15 STRL SS (BLADE)
CANISTER SUCTION 2500CC (MISCELLANEOUS) ×3 IMPLANT
CONT SPEC 4OZ CLIKSEAL STRL BL (MISCELLANEOUS) IMPLANT
COVER MAYO STAND STRL (DRAPES) ×3 IMPLANT
COVER TABLE BACK 60X90 (DRAPES) ×3 IMPLANT
DRAPE PROXIMA HALF (DRAPES) ×1 IMPLANT
GAUZE SPONGE 4X4 12PLY STRL (GAUZE/BANDAGES/DRESSINGS) ×3 IMPLANT
GLOVE SS BIOGEL STRL SZ 7.5 (GLOVE) ×1 IMPLANT
GLOVE SUPERSENSE BIOGEL SZ 7.5 (GLOVE) ×2
GUARD TEETH (MISCELLANEOUS) IMPLANT
KIT BASIN OR (CUSTOM PROCEDURE TRAY) ×3 IMPLANT
KIT ROOM TURNOVER OR (KITS) ×3 IMPLANT
NDL PRECISIONGLIDE 27X1.5 (NEEDLE) IMPLANT
NEEDLE PRECISIONGLIDE 27X1.5 (NEEDLE) IMPLANT
NS IRRIG 1000ML POUR BTL (IV SOLUTION) ×1 IMPLANT
PAD ARMBOARD 7.5X6 YLW CONV (MISCELLANEOUS) ×6 IMPLANT
PAD EYE OVAL STERILE LF (GAUZE/BANDAGES/DRESSINGS) IMPLANT
PATTIES SURGICAL .5 X3 (DISPOSABLE) IMPLANT
SOLUTION ANTI FOG 6CC (MISCELLANEOUS) IMPLANT
SPONGE INTESTINAL PEANUT (DISPOSABLE) IMPLANT
SURGILUBE 2OZ TUBE FLIPTOP (MISCELLANEOUS) ×3 IMPLANT
TOWEL OR 17X24 6PK STRL BLUE (TOWEL DISPOSABLE) ×6 IMPLANT
TRAP SPECIMEN MUCOUS 40CC (MISCELLANEOUS) IMPLANT
TUBE CONNECTING 12'X1/4 (SUCTIONS) ×1
TUBE CONNECTING 12X1/4 (SUCTIONS) ×2 IMPLANT
WATER STERILE IRR 1000ML POUR (IV SOLUTION) ×1 IMPLANT

## 2016-09-26 NOTE — Progress Notes (Signed)
Wasted 150 cc fentanyl, witnessed by RN Grandville Silos D.

## 2016-09-26 NOTE — Progress Notes (Signed)
RT transported patient to OR for extubation. No complications. RT will continue to monitor.

## 2016-09-26 NOTE — Progress Notes (Signed)
PULMONARY / CRITICAL CARE MEDICINE   Name: TABAITHA SEEDORF MRN: [id number] DOB: [birth year]    ADMISSION DATE:  09/24/2016 CONSULTATION DATE:  12/7  REFERRING MD:  Dr Venora Maples  CHIEF COMPLAINT:  Acute resp failure, aspiration, chocking episode on chicken  HISTORY OF PRESENT ILLNESS:  70 yr old history chest truama, with trach 25 years ago requiring RT pneumonectomy and trach then and history esoph dilation, myltiple er viists for chokcing who was found again choking which required resuscitation by EMS including positive pressure bag mask ventilation after they found the patient to be apneic and blue.  She was eating chicken whehn this occurred.  She has had dysphagia before and has had esophageal dilation 1.  EMS report that when they suctioned her a large piece of thickened mucus came out of her throat.  EDP reported inspiratory stridor. Requird emergent intubation. Cords wnl, intially 7.5 attempted then rmeoved as hit some subglottic obstruction. ETT changed to 6.5.  Called for admission.  SUBJECTIVE:   Pt to OR with ENT and GI with extubation . EGD w/ Benign-appearing esophageal stenosis., - LA Grade B reflux esophagitis.and medium amount of food (residue) in the stomach.,  She returns with good cough and strong voice. She does have some mild confusion,      VITAL SIGNS: BP 140/79 (BP Location: Right Arm)   Pulse 90   Temp 98.5 F (36.9 C) (Oral)   Resp 17   Ht 5\' 6"  (1.676 m)   Wt 77.8 kg (171 lb 8.3 oz)   SpO2 99%   BMI 27.68 kg/m   HEMODYNAMICS:    VENTILATOR SETTINGS: Vent Mode: CPAP;PSV FiO2 (%):  [40 %] 40 % Set Rate:  [30 bmp] 30 bmp Vt Set:  [300 mL] 300 mL PEEP:  [5 cmH20] 5 cmH20 Pressure Support:  [5 cmH20] 5 cmH20 Plateau Pressure:  [18 cmH20-23 cmH20] 19 cmH20  INTAKE / OUTPUT: I/O last 3 completed shifts: In: 3784.2 [I.V.:3134.2; IV Piggyback:650] Out: [id number]  PHYSICAL EXAMINATION: General:  Awake , sitting up in bed  Neuro:  Moving all 4  extremities. , nml grips , no facial droop follows commands, a/ox 3 , mild agitation,   HEENT:  nml voice  No scleral icterus or injection. Cardiovascular:  Regular rate. No edema. No JVD. Pulmonary:  CTA , no wheezing , strong cough   Abdomen:  Soft. Nondistended. Normal bowel sounds. Integument:  Warm & dry. No rash on exposed skin.   LABS:  BMET  Recent Labs Lab 09/24/16 1619 09/24/16 2011 09/25/16 0221 09/26/16 0603  NA 137  --  138 137  K 3.3*  --  4.8 3.7  CL 106  --  110 108  CO2 21*  --  17* 21*  BUN 19  --  15 14  CREATININE 1.18* 1.17* 1.00 0.97  GLUCOSE 247*  --  153* 113*    Electrolytes  Recent Labs Lab 09/24/16 1619 09/25/16 0221 09/26/16 0603  CALCIUM 7.8* 7.9* 8.0*  MG  --   --  2.0  PHOS  --   --  2.2*    CBC  Recent Labs Lab 09/24/16 2011 09/25/16 0221 09/26/16 0603  WBC 14.3* 11.9* 12.1*  HGB 14.5 13.0 12.0  HCT 43.9 38.1 36.1  PLT 213 188 168    Coag's No results for input(s): APTT, INR in the last 168 hours.  Sepsis Markers  Recent Labs Lab 09/24/16 2011 09/24/16 2301 09/26/16 0603  LATICACIDVEN 6.0* 5.0* 1.7  PROCALCITON 0.78  --   --  ABG  Recent Labs Lab 09/24/16 1718 09/24/16 1835 09/25/16 0400  PHART 7.244* 7.318* 7.428  PCO2ART 52.3* 36.6 29.1*  PO2ART 165.0* 91.0 66.4*    Liver Enzymes  Recent Labs Lab 09/26/16 0603  AST 30  ALT 33  ALKPHOS 52  BILITOT 0.8  ALBUMIN 2.8*  2.8*    Cardiac Enzymes No results for input(s): TROPONINI, PROBNP in the last 168 hours.  Glucose  Recent Labs Lab 09/25/16 1951 09/25/16 2325 09/26/16 0341 09/26/16 0836 09/26/16 1227 09/26/16 1542  GLUCAP 95 103* 105* 113* 106* 112*    Imaging No results found.   STUDIES:  CT Neck/Soft Tissue 12/7: IMPRESSION: Endotracheal tube in the midtrachea.  No subglottic stenosis.  No retained foreign body or chicken bone. Dilated esophagus with air-fluid level in retained food.  Negative for mass lesion.  CT  Chest W/O 12/7: IMPRESSION: 1. Status post right pneumonectomy. 2. Basilar and dependent predominant airspace and ground-glass opacity throughout the left lung. Favor aspiration. Infection could look similar. 3. Dilated esophagus with debris throughout. This suggests severe dysmotility and/or gastroesophageal reflux disease. Patient is predisposed all high at to aspiration. 4. Cardiomegaly. Coronary artery atherosclerosis. Aortic atherosclerosis.  MICROBIOLOGY: MRSA PCR 12/7:  Negative Blood Ctx x2 12/7 >> BAL LLL 12/7 >>  ANTIBIOTICS: Vancomycin 12/7 - 12/8 Unasyn 12/7 >>  SIGNIFICANT EVENTS: 12/07 - choked on chicken, obstruction airway, small ETT plaed, Bronch 12/9 -OR for extubation , EGD  w/ Benign-appearing esophageal stenosis., - LA Grade B reflux esophagitis.and medium amount of food (residue) in the stomach.,    LINES/TUBES: OETT 6.5 12/7 >>12/9 Foley 12/7 >> PIV x2  ASSESSMENT / PLAN:  PULMONARY A: Acute Hypoxic Respiratory Failure - Secondary to aspiration while choking on food. Probable/Possible Subglottic Obstruction - Prior trach in 1990. Left Lung Aspiration Pneumonia vs Pneumonitis - S/P Bronch 12/7. S/P Right Pneumonectomy  P:   O2 to keep sat >90%  CARDIOVASCULAR A:  Hypotension - Secondary to sedation in ED. Resolved. H/O Hyperlipidemia - Not on home meds.  P:  Continuous telemetry monitoring Vitals per unit protocol Goal MAP >65  RENAL A:   Hypokalemia - Resolved. Lactic Acidosis - Improving. NAGMA - Likely due to lactic acid.  P:   Trending UOP Monitoring electrolytes & renal function daily Replacing electrolytes as indicated    GASTROINTESTINAL A:   Food Impaction H/O Esophageal Stricture - Prior dilation.  P:    Pepcid IV q12hr Speech Evaluation may be needed.   HEMATOLOGIC A:   Leukocytosis - Likely reactive.  P:  Trending cell counts daily w/ CBC SCDs Heparin [place] q8hr  INFECTIOUS A:   Aspiration Pneumonia vs  Pneumonitis PCT 0.78 P:    Continuing Empiric Unasyn Day #3 Awaiting Culture Results   ENDOCRINE A:   Hyperglycemia - Likely secondary to Decadron.  P:   Checking Hgb A1c w/ AM Labs SSI per Sensitive Algorithm Accu-Checks q4hr  NEUROLOGIC A:   Sedation on Ventilator >resolved  Mild confusion post op ? Sedation related  P:   Monitor  Cont IVF  Avoid sedating rx.   FAMILY  - Updates: updated pt and family at bedside   - Inter-disciplinary family meet or Palliative Care meeting due by:  12/14  TODAY'S SUMMARY:  70 y.o. female with aspiration and prior esophageal stricture requiring dilation. Treating for aspiration pneumonia with Unasyn only. ENT /GI w/ pt to OR w/ extubation and EGD showing benign-appearing esophageal stenosis,  LA Grade B reflux esophagitis., and med amount of food (  residue) in the stomach. Pt w/ some mild confusion after procedure .    Genowefa Morga NP-C  [facility]  [phone]   09/26/2016

## 2016-09-26 NOTE — Transfer of Care (Signed)
Immediate Anesthesia Transfer of Care Note  Patient: Erika Walker  Procedure(s) Performed: Procedure(s): MICRO LARYNGOSCOPY AND  EXTUBATION (N/A)  Patient Location: PACU  Anesthesia Type:General  Level of Consciousness: awake, alert , oriented and patient cooperative  Airway & Oxygen Therapy: Patient Spontanous Breathing and Patient connected to nasal cannula oxygen  Post-op Assessment: Report given to RN, Post -op Vital signs reviewed and stable and Patient moving all extremities  Post vital signs: Reviewed and stable  Last Vitals:  Vitals:   09/26/16 1226 09/26/16 1357  BP:  (!) 141/74  Pulse:  87  Resp:  19  Temp: 37.9 C     Last Pain:  Vitals:   09/26/16 1226  TempSrc: Oral         Complications: No apparent anesthesia complications

## 2016-09-26 NOTE — Anesthesia Preprocedure Evaluation (Signed)
Anesthesia Evaluation  Patient identified by MRN, date of birth, ID band Patient awake    Reviewed: Allergy & Precautions, NPO status , Patient's Chart, lab work & pertinent test results  Airway Mallampati: Intubated       Dental   Pulmonary    breath sounds clear to auscultation       Cardiovascular  Rhythm:Regular Rate:Normal     Neuro/Psych    GI/Hepatic   Endo/Other    Renal/GU      Musculoskeletal   Abdominal   Peds  Hematology   Anesthesia Other Findings   Reproductive/Obstetrics                             Anesthesia Physical Anesthesia Plan  ASA: III  Anesthesia Plan: General   Post-op Pain Management:    Induction: Intravenous  Airway Management Planned: Oral ETT  Additional Equipment:   Intra-op Plan:   Post-operative Plan: Extubation in OR  Informed Consent: I have reviewed the patients History and Physical, chart, labs and discussed the procedure including the risks, benefits and alternatives for the proposed anesthesia with the patient or authorized representative who has indicated his/her understanding and acceptance.     Plan Discussed with: CRNA and Anesthesiologist  Anesthesia Plan Comments:         Anesthesia Quick Evaluation

## 2016-09-26 NOTE — Anesthesia Postprocedure Evaluation (Signed)
Anesthesia Post Note  Patient: Erika Walker  Procedure(s) Performed: Procedure(s) (LRB): MICRO LARYNGOSCOPY AND  EXTUBATION (N/A)  Patient location during evaluation: PACU Anesthesia Type: General Level of consciousness: awake and oriented Pain management: pain level controlled Vital Signs Assessment: post-procedure vital signs reviewed and stable Respiratory status: spontaneous breathing, respiratory function stable, nonlabored ventilation and patient connected to nasal cannula oxygen Cardiovascular status: blood pressure returned to baseline Anesthetic complications: no    Last Vitals:  Vitals:   09/26/16 1357 09/26/16 1411  BP: (!) 141/74 (!) 143/70  Pulse: 87 93  Resp: 19 (!) 22  Temp: 37.1 C     Last Pain:  Vitals:   09/26/16 1226  TempSrc: Oral                 [name] COKER

## 2016-09-26 NOTE — Anesthesia Procedure Notes (Signed)
Date/Time: 09/26/2016 1:07 PM Performed by: Izora Gala Pre-anesthesia Checklist: Patient identified, Emergency Drugs available, Suction available and Patient being monitored Patient Re-evaluated:Patient Re-evaluated prior to inductionOxygen Delivery Method: Circle system utilized Intubation Type: Inhalational induction and Inhalational induction with existing ETT Placement Confirmation: positive ETCO2 and breath sounds checked- equal and bilateral

## 2016-09-26 NOTE — Op Note (Signed)
OPERATIVE REPORT  DATE OF SURGERY: 09/26/2016  PATIENT:  Erika Walker,  70 y.o. female  PRE-OPERATIVE DIAGNOSIS:  Respiratory Failure  POST-OPERATIVE DIAGNOSIS:  Respiratory Failure  PROCEDURE:  Procedure(s): MICRO LARYNGOSCOPY AND  EXTUBATION  SURGEON:  Beckie Salts, MD  ASSISTANTS: none  ANESTHESIA:   General   EBL:  0 ml  DRAINS: none  LOCAL MEDICATIONS USED:  None  SPECIMEN:  none  COUNTS:  Correct  PROCEDURE DETAILS: The patient was taken to the operating room and placed on the operating table in the supine position. Following induction of general endotracheal anesthesia, the table was turned and the patient was draped in a standard fashion. A maxillary tooth guard was used. A Jako laryngoscope was used to visualize the laryngeal airway and attached to the Mayo stand with the suspension apparatus. The endotracheal tube was removed. The airway was suctioned. The larynx appeared normal but swollen around the level of the cords. A 0 Hopkins rod was used to inspect the subglottic and upper tracheal airway. There was some irritation of the subglottic mucosa but no appearance of stenosis. Beyond that looked healthy as well. The endotracheal tube was replaced with a #7. This passed without difficulty. The laryngoscope was removed. The patient was then handed back to anesthesia who allowed for awakening and she was then extubated, breathing without difficulty without any evidence of stridor or distress.    PATIENT DISPOSITION:  To PACU, stable

## 2016-09-27 ENCOUNTER — Encounter (HOSPITAL_COMMUNITY): Payer: Self-pay | Admitting: Otolaryngology

## 2016-09-27 LAB — RENAL FUNCTION PANEL
ALBUMIN: 2.6 g/dL — AB (ref 3.5–5.0)
ANION GAP: 6 (ref 5–15)
BUN: 10 mg/dL (ref 6–20)
CALCIUM: 8 mg/dL — AB (ref 8.9–10.3)
CO2: 28 mmol/L (ref 22–32)
CREATININE: 0.98 mg/dL (ref 0.44–1.00)
Chloride: 104 mmol/L (ref 101–111)
GFR calc Af Amer: >60 mL/min (ref >60)
GFR calc non Af Amer: 58 mL/min — ABNORMAL LOW (ref >60)
GLUCOSE: 97 mg/dL (ref 65–99)
Phosphorus: 3.2 mg/dL (ref 2.5–4.6)
Potassium: 3.6 mmol/L (ref 3.5–5.1)
SODIUM: 138 mmol/L (ref 135–145)

## 2016-09-27 LAB — CULTURE, RESPIRATORY W GRAM STAIN: Culture: NORMAL

## 2016-09-27 LAB — BASIC METABOLIC PANEL
Anion gap: 7 (ref 5–15)
BUN: 10 mg/dL (ref 6–20)
CALCIUM: 8 mg/dL — AB (ref 8.9–10.3)
CO2: 27 mmol/L (ref 22–32)
CREATININE: 1.04 mg/dL — AB (ref 0.44–1.00)
Chloride: 104 mmol/L (ref 101–111)
GFR, EST NON AFRICAN AMERICAN: 54 mL/min — AB (ref >60)
Glucose, Bld: 97 mg/dL (ref 65–99)
Potassium: 3.6 mmol/L (ref 3.5–5.1)
SODIUM: 138 mmol/L (ref 135–145)

## 2016-09-27 LAB — GLUCOSE, CAPILLARY
GLUCOSE-CAPILLARY: 91 mg/dL (ref 65–99)
GLUCOSE-CAPILLARY: 105 mg/dL — AB (ref 65–99)
Glucose-Capillary: 85 mg/dL (ref 65–99)
Glucose-Capillary: 97 mg/dL (ref 65–99)
Glucose-Capillary: 161 mg/dL — ABNORMAL HIGH (ref 65–99)

## 2016-09-27 LAB — CBC WITH DIFFERENTIAL/PLATELET
BASOS PCT: 0 %
Basophils Absolute: 0 10*3/uL (ref 0.0–0.1)
EOS PCT: 0 %
Eosinophils Absolute: 0 10*3/uL (ref 0.0–0.7)
HEMATOCRIT: 35.2 % — AB (ref 36.0–46.0)
Hemoglobin: 11.5 g/dL — ABNORMAL LOW (ref 12.0–15.0)
Lymphocytes Relative: 14 %
Lymphs Abs: 1.5 10*3/uL (ref 0.7–4.0)
MCH: 31.2 pg (ref 26.0–34.0)
MCHC: 32.7 g/dL (ref 30.0–36.0)
MCV: 95.4 fL (ref 78.0–100.0)
MONO ABS: 0.8 10*3/uL (ref 0.1–1.0)
MONOS PCT: 8 %
NEUTROS ABS: 8 10*3/uL — AB (ref 1.7–7.7)
Neutrophils Relative %: 78 %
PLATELETS: 157 10*3/uL (ref 150–400)
RBC: 3.69 MIL/uL — ABNORMAL LOW (ref 3.87–5.11)
RDW: 12.7 % (ref 11.5–15.5)
WBC: 10.3 10*3/uL (ref 4.0–10.5)

## 2016-09-27 LAB — HEMOGLOBIN A1C
Hgb A1c MFr Bld: 5.4 % (ref 4.8–5.6)
Mean Plasma Glucose: 108 mg/dL

## 2016-09-27 LAB — CULTURE, RESPIRATORY

## 2016-09-27 LAB — MAGNESIUM: Magnesium: 2 mg/dL (ref 1.7–2.4)

## 2016-09-27 MED ORDER — PANTOPRAZOLE SODIUM 40 MG PO PACK
40.0000 mg | PACK | Freq: Two times a day (BID) | ORAL | Status: DC
  Filled 2016-09-27: qty 20

## 2016-09-27 MED ORDER — PANTOPRAZOLE SODIUM 40 MG PO TBEC
40.0000 mg | DELAYED_RELEASE_TABLET | Freq: Two times a day (BID) | ORAL | Status: DC
  Administered 2016-09-27 – 2016-09-28 (×2): 40 mg via ORAL
  Filled 2016-09-27 (×2): qty 1

## 2016-09-27 MED ORDER — LEVOFLOXACIN 500 MG PO TABS
750.0000 mg | ORAL_TABLET | Freq: Every day | ORAL | Status: DC
  Administered 2016-09-27 – 2016-09-28 (×2): 750 mg via ORAL
  Filled 2016-09-27: qty 1
  Filled 2016-09-27: qty 2

## 2016-09-27 NOTE — Progress Notes (Signed)
Pharmacy Antibiotic Note Erika Walker is a 70 y.o. female admitted on 09/24/2016 with concern for aspiration pneumonia s/p choking episode. Currently on day 3 of treatment with Unasyn.  Plan: Continue Unasyn 3 grams IV q8h   Height: 5\' 6"  (167.6 cm) Weight: 162 lb 11.2 oz (73.8 kg) IBW/kg (Calculated) : 59.3  Temp (24hrs), Avg:99.1 F (37.3 C), Min:98.1 F (36.7 C), Max:100.5 F (38.1 C)   Recent Labs Lab 09/24/16 1619 09/24/16 2011 09/24/16 2301 09/25/16 0221 09/26/16 0603 09/27/16 0250  WBC 18.9* 14.3*  --  11.9* 12.1* 10.3  CREATININE 1.18* 1.17*  --  1.00 0.97 1.04*  0.98  LATICACIDVEN 4.2* 6.0* 5.0*  --  1.7  --     Estimated Creatinine Clearance: 52.5 mL/min (by C-G formula based on SCr of 1.04 mg/dL (H)).    No Known Allergies  Antimicrobials this admission:   Unasyn 12/7>> Vancomycin 12/7>>12/8   Microbiology results:  12/7 Resp cx: nornal flora  12/7 Blood x 2: ngtd 12/7 MRSA PCR: negative    Vincenza Hews, PharmD, BCPS 09/27/2016, 11:01 AM Pager: [phone]

## 2016-09-27 NOTE — Evaluation (Signed)
Clinical/Bedside Swallow Evaluation Patient Details  Name: Erika Walker MRN: [id number] Date of Birth: 1946-06-23  Today's Date: 09/27/2016 Time: SLP Start Time (ACUTE ONLY): [id number] SLP Stop Time (ACUTE ONLY): 0952 SLP Time Calculation (min) (ACUTE ONLY): 14 min  Past Medical History:  Past Medical History:  Diagnosis Date  . HLD (hyperlipidemia)    Past Surgical History:  Past Surgical History:  Procedure Laterality Date  . COLONOSCOPY  2014   Dr Olevia Perches  . LARYNGOSCOPY AND BRONCHOSCOPY N/A 09/26/2016   Procedure: MICRO LARYNGOSCOPY AND  EXTUBATION;  Surgeon: Izora Gala, MD;  Location: [facility];  Service: ENT;  Laterality: N/A;  . lung removed Right 1992   post trauma   HPI:  70 yr old admitted for acute respiratory failure due to choking episode while eating chicken. She was intubated 12/7-12/9. CT Chest 12/7 showed concern for aspiration and dilated esophagus wtih debris throughout, suggestive of severe dysmotility and/or GERD. EGD 12/8 showed stricture at the GE junction. She has had multiple ER visits for choking and had an esophageal dilation x1. MBS performed in 2014 showed normal oropharyngeal swallow.    Assessment / Plan / Recommendation Clinical Impression  Pt describes a long h/o esophageal issues and choking episodes with solid foods. She initially consumed POs without any overt s/s of aspiration, although after SLP left the room there was delayed coughing. Suspect that her swallowing difficulties are primarily esophageal in nature, although she does remain at risk for post-prandial aspiration. Recommend to start with a softer, chopped diet to facilitate esophageal clearance. SLP provided education and Min cues for use of esophageal precautions throughout trials today. Will f/u for tolerance, implementation of strategies, and potential for diet advancement.     Aspiration Risk  Mild aspiration risk;Moderate aspiration risk    Diet Recommendation Dysphagia 2 (Fine  chop);Thin liquid   Liquid Administration via: Cup;Straw Medication Administration: Crushed with puree Supervision: Patient able to self feed;Intermittent supervision to cue for compensatory strategies Compensations: Slow rate;Small sips/bites;Follow solids with liquid Postural Changes: Seated upright at 90 degrees;Remain upright for at least 30 minutes after po intake    Other  Recommendations Recommended Consults: Consider GI evaluation;Consider esophageal assessment Oral Care Recommendations: Oral care BID   Follow up Recommendations None      Frequency and Duration min 2x/week  1 week       Prognosis Prognosis for Safe Diet Advancement: Fair Barriers to Reach Goals: Other (Comment) (esophageal hx)      Swallow Study   General HPI: 70 yr old admitted for acute respiratory failure due to choking episode while eating chicken. She was intubated 12/7-12/9. CT Chest 12/7 showed concern for aspiration and dilated esophagus wtih debris throughout, suggestive of severe dysmotility and/or GERD. EGD 12/8 showed stricture at the GE junction. She has had multiple ER visits for choking and had an esophageal dilation x1. MBS performed in 2014 showed normal oropharyngeal swallow.  Type of Study: Bedside Swallow Evaluation Previous Swallow Assessment: see HPI Diet Prior to this Study: NPO Temperature Spikes Noted: Yes (100.5) Respiratory Status: Nasal cannula History of Recent Intubation: Yes Length of Intubations (days): 2 days Date extubated: 09/26/16 Behavior/Cognition: Alert;Cooperative;Pleasant mood Oral Cavity Assessment: Within Functional Limits Oral Care Completed by SLP: No Oral Cavity - Dentition: Adequate natural dentition Vision: Functional for self-feeding Self-Feeding Abilities: Able to feed self Patient Positioning: Upright in bed Baseline Vocal Quality: Normal    Oral/Motor/Sensory Function     Ice Chips Ice chips: Not tested   Thin Liquid  Thin Liquid: Within  functional limits Presentation: Self Fed;Cup;Straw    Nectar Thick Nectar Thick Liquid: Not tested   Honey Thick Honey Thick Liquid: Not tested   Puree Puree: Within functional limits Presentation: Self Fed;Spoon   Solid   GO   Solid: Impaired Presentation: Self Fed Pharyngeal Phase Impairments: Cough - Delayed        Germain Osgood 09/27/2016,10:16 AM  Germain Osgood, M.A. CCC-SLP [phone]

## 2016-09-27 NOTE — Progress Notes (Signed)
PULMONARY / CRITICAL CARE MEDICINE   Name: Erika Walker MRN: [id number] DOB: Dec 02, 1945    ADMISSION DATE:  09/24/2016 CONSULTATION DATE:  12/7  REFERRING MD:  Dr Venora Maples  CHIEF COMPLAINT:  Acute resp failure, aspiration, chocking episode on chicken  HISTORY OF PRESENT ILLNESS:  70 yr old female with hx dysphagia, previous trach admitted 12/7 with episode of choking requiring intubation which was very difficult with likely aspiration.  Extubated 12/9 in OR with ENT.   SUBJECTIVE:   Extubated yesterday in OR with ENT present.   Awake and alert this am. Passed swallow eval. Eating lunch without difficulty.   VITAL SIGNS: BP (!) 161/68 (BP Location: Left Arm)   Pulse 72   Temp 98.2 F (36.8 C) (Oral)   Resp (!) 21   Ht 5\' 6"  (1.676 m)   Wt 73.8 kg (162 lb 11.2 oz)   SpO2 99%   BMI 26.26 kg/m    INTAKE / OUTPUT: I/O last 3 completed shifts: In: 3253.6 [I.V.:2953.6; IV Piggyback:300] Out: 3625 [Urine:3625]  PHYSICAL EXAMINATION: General:  Awake , sitting up in bed  Neuro:  Awake alert appropriate MAE  HEENT:  nml voice  No scleral icterus or injection. Cardiovascular:  Regular rate. No edema. No JVD. Pulmonary:  resps even non labored on [place], CTA , no wheezing , strong cough   Abdomen:  Soft. Nondistended. Normal bowel sounds. Integument:  Warm & dry. No rash on exposed skin.   LABS:  BMET  Recent Labs Lab 09/25/16 0221 09/26/16 0603 09/27/16 0250  NA 138 137 138  138  K 4.8 3.7 3.6  3.6  CL 110 108 104  104  CO2 17* 21* 27  28  BUN 15 14 10  10   CREATININE 1.00 0.97 1.04*  0.98  GLUCOSE 153* 113* 97  97    Electrolytes  Recent Labs Lab 09/25/16 0221 09/26/16 0603 09/27/16 0250  CALCIUM 7.9* 8.0* 8.0*  8.0*  MG  --  2.0 2.0  PHOS  --  2.2* 3.2    CBC  Recent Labs Lab 09/25/16 0221 09/26/16 0603 09/27/16 0250  WBC 11.9* 12.1* 10.3  HGB 13.0 12.0 11.5*  HCT 38.1 36.1 35.2*  PLT 188 168 157    Coag's No results for input(s):  APTT, INR in the last 168 hours.  Sepsis Markers  Recent Labs Lab 09/24/16 2011 09/24/16 2301 09/26/16 0603  LATICACIDVEN 6.0* 5.0* 1.7  PROCALCITON 0.78  --   --     ABG  Recent Labs Lab 09/24/16 1718 09/24/16 1835 09/25/16 0400  PHART 7.244* 7.318* 7.428  PCO2ART 52.3* 36.6 29.1*  PO2ART 165.0* 91.0 66.4*    Liver Enzymes  Recent Labs Lab 09/26/16 0603 09/27/16 0250  AST 30  --   ALT 33  --   ALKPHOS 52  --   BILITOT 0.8  --   ALBUMIN 2.8*  2.8* 2.6*    Cardiac Enzymes No results for input(s): TROPONINI, PROBNP in the last 168 hours.  Glucose  Recent Labs Lab 09/26/16 1542 09/26/16 2004 09/27/16 0000 09/27/16 0358 09/27/16 0841 09/27/16 1214  GLUCAP 112* 102* 92 91 85 105*    Imaging No results found.   STUDIES:  CT Neck/Soft Tissue 12/7: IMPRESSION: Endotracheal tube in the midtrachea.  No subglottic stenosis.  No retained foreign body or chicken bone. Dilated esophagus with air-fluid level in retained food.  Negative for mass lesion.  CT Chest W/O 12/7: IMPRESSION: 1. Status post right pneumonectomy. 2. Basilar and  dependent predominant airspace and ground-glass opacity throughout the left lung. Favor aspiration. Infection could look similar. 3. Dilated esophagus with debris throughout. This suggests severe dysmotility and/or gastroesophageal reflux disease. Patient is predisposed all high at to aspiration. 4. Cardiomegaly. Coronary artery atherosclerosis. Aortic atherosclerosis.  MICROBIOLOGY: MRSA PCR 12/7:  Negative Blood Ctx x2 12/7 >> BAL LLL 12/7 >>  ANTIBIOTICS: Vancomycin 12/7 - 12/8 Unasyn 12/7 >>  SIGNIFICANT EVENTS: 12/07 - choked on chicken, obstruction airway, small ETT plaed, Bronch 12/9 -OR for extubation , EGD  w/ Benign-appearing esophageal stenosis., - LA Grade B reflux esophagitis.and medium amount of food (residue) in the stomach.,    LINES/TUBES: OETT 6.5 12/7 >>12/9 Foley 12/7 >> PIV x2  ASSESSMENT  / PLAN:  PULMONARY A: Acute Hypoxic Respiratory Failure - Secondary to aspiration while choking on food - resolved.  Probable/Possible Subglottic Obstruction - Prior trach in 1990. Left Lung Aspiration Pneumonia vs Pneumonitis - S/P Bronch 12/7. S/P Right Pneumonectomy  P:   Wean O2 to keep sat >90% Mobilize   CARDIOVASCULAR A:  Hypotension - Secondary to sedation in ED. Resolved. H/O Hyperlipidemia - Not on home meds.  P:  PCP f.u    RENAL A:   Hypokalemia - Resolved. Lactic Acidosis - resolved.  NAGMA - Likely due to lactic acid.  P:   Trending UOP Monitoring electrolytes & renal function daily Replacing electrolytes as indicated    GASTROINTESTINAL A:   Food Impaction H/O Esophageal Stricture - Prior dilation.  P:   BID protonix on d/c per GI  outpt GI f/u in 2-3 weeks with Ardis Hughs to discuss esophageal dilatation    HEMATOLOGIC A:   Leukocytosis - Likely reactive.  P:  Trending cell counts daily w/ CBC SCDs Heparin Wagoner q8hr  INFECTIOUS A:   Aspiration Pneumonia vs Pneumonitis PCT 0.78 P:    Continuing Empiric Unasyn Day #4 Transition to PO levaquin for total 7 days    ENDOCRINE A:   Hyperglycemia - Likely secondary to Decadron. Hgb A1c normal   P:   SSI per Sensitive Algorithm Accu-Checks q4hr  NEUROLOGIC A:   Sedation on Ventilator >resolved  Mild confusion post op ? Sedation related - resolved.  P:   moblilize   FAMILY  - Updates: updated pt and sons at length at bedside 12/10  - Inter-disciplinary family meet or Palliative Care meeting due by:  12/14  Will tx to floor, keep on PCCM service for d/c am 12/11  Nickolas Madrid, NP 09/27/2016  1:31 PM Pager: [phone] or [phone]

## 2016-09-27 NOTE — Progress Notes (Addendum)
Report called to nurse. Patient's vitals are stable. Belongings will be transferred with patient. Nurse will notify brother of new room assignment.

## 2016-09-28 ENCOUNTER — Encounter (HOSPITAL_COMMUNITY): Payer: Self-pay | Admitting: Gastroenterology

## 2016-09-28 ENCOUNTER — Ambulatory Visit: Payer: Medicare Other | Admitting: Internal Medicine

## 2016-09-28 ENCOUNTER — Inpatient Hospital Stay (HOSPITAL_COMMUNITY): Payer: Medicare Other

## 2016-09-28 LAB — CBC
HCT: 36 % (ref 36.0–46.0)
Hemoglobin: 12 g/dL (ref 12.0–15.0)
MCH: 30.9 pg (ref 26.0–34.0)
MCHC: 33.3 g/dL (ref 30.0–36.0)
MCV: 92.8 fL (ref 78.0–100.0)
PLATELETS: 182 10*3/uL (ref 150–400)
RBC: 3.88 MIL/uL (ref 3.87–5.11)
RDW: 12.3 % (ref 11.5–15.5)
WBC: 8.3 10*3/uL (ref 4.0–10.5)

## 2016-09-28 LAB — RENAL FUNCTION PANEL
Albumin: 2.5 g/dL — ABNORMAL LOW (ref 3.5–5.0)
Anion gap: 11 (ref 5–15)
BUN: 12 mg/dL (ref 6–20)
CHLORIDE: 103 mmol/L (ref 101–111)
CO2: 25 mmol/L (ref 22–32)
CREATININE: 0.94 mg/dL (ref 0.44–1.00)
Calcium: 8.6 mg/dL — ABNORMAL LOW (ref 8.9–10.3)
GFR calc Af Amer: >60 mL/min (ref >60)
GLUCOSE: 108 mg/dL — AB (ref 65–99)
POTASSIUM: 3.7 mmol/L (ref 3.5–5.1)
Phosphorus: 3.9 mg/dL (ref 2.5–4.6)
Sodium: 139 mmol/L (ref 135–145)

## 2016-09-28 LAB — MAGNESIUM: MAGNESIUM: 2 mg/dL (ref 1.7–2.4)

## 2016-09-28 LAB — GLUCOSE, CAPILLARY: Glucose-Capillary: 119 mg/dL — ABNORMAL HIGH (ref 65–99)

## 2016-09-28 MED ORDER — LEVOFLOXACIN 750 MG PO TABS: 750.0000 mg | ORAL_TABLET | Freq: Every day | ORAL | 0 refills | Status: DC

## 2016-09-28 MED ORDER — PANTOPRAZOLE SODIUM 40 MG PO TBEC: 40.0000 mg | DELAYED_RELEASE_TABLET | Freq: Two times a day (BID) | ORAL | 6 refills | Status: DC

## 2016-09-28 NOTE — Care Management Important Message (Signed)
Important Message  Patient Details  Name: Erika Walker MRN: [id number] Date of Birth: 11-20-1945   Medicare Important Message Given:  Yes    Cahlil Sattar Montine Circle 09/28/2016, 11:52 AM

## 2016-09-28 NOTE — Discharge Summary (Signed)
Physician Discharge Summary       Patient ID: Erika Walker MRN: [id number] DOB/AGE: 70/06/1946 70 y.o.  Admit date: 09/24/2016 Discharge date: 09/28/2016  Discharge Diagnoses:   Acute Hypoxic Respiratory failure  Aspiration pneumonitis  Prior right pneumonectomy  Esophageal stricture  Hiatal hernia  Esophagitis Hyperglycemia   Detailed Hospital Course:   70 yr old history chest truama, with trach 25 years ago requiring RT pneumonectomy and trach then and history esoph dilation, myltiple er viists for choking. Called EMS on 12/7 after she started choking on a piece of chicken. On EMS arrival required resuscitation including positive pressure bag mask ventilation after they found the patient to be apneic and blue. EMS report that when they suctioned her a large piece of thickened mucus came out of her throat. EDP reported inspiratory stridor. Requird emergent intubation. Cords wnl, intially 7.5 attempted then rmeoved as hit some subglottic obstruction. ETT changed to 6.5. Called for admission. Was admitted to the intensive care. Supportive care included: mechanical ventilation, gentle hydration and empiric antibiotics for aspiration. Both ENT and GI services were consulted. She underwent EGD by Dr Fuller Plan on 128 which showed esophageal stenosis, reflux esophagitis and small hiatal hernia. They signed off after this recommending GI referral as an out-pt in 2-3 weeks w/ Dr Ardis Hughs to consider repeat dilation. The following day she was brought to the OR and extubated w/ ENT at bedside for micro larygoscopy. This did not show any evidence of subglottic stenosis as initial concerns. She was subsequently extubated. ABX narrowed and she was transferred out of the intensive care. She was seen by SLP who made recommendations to assist w/ her dysphagia. She was ambulated in the hall prior to dc on 12/11 and did not demonstrate any desaturations so she was therefore deemed safe for discharge w/ the  following plan for active issues listed below:    Discharge Plan by active problems  Esophageal stricture -dysphagia 2 soft diet -f/u Dr Ardis Hughs to consider possible esophageal dilation   Reflux esophagitis  -PPI BID  Aspiration PNA -day 5/7 abx (home on levaquin) -12/18 at 70 w/ [facility] tests/ studies  Consults: GI, ENT and anesthesia services  12/8 EGD (stark) - Benign-appearing esophageal stenosis. - LA Grade B reflux esophagitis. - Small hiatal hernia - A medium amount of food (residue) in the stomach. - Normal duodenal bulb and second portion of the duodenum. 12/9: MICRO LARYNGOSCOPY AND  EXTUBATION (rosen) - . The larynx appeared normal but swollen around the level of the cords. A 0 Hopkins rod was used to inspect the subglottic and upper tracheal airway. There was some irritation of the subglottic mucosa but no appearance of stenosis. Beyond that looked healthy as well   Discharge Exam: BP (!) 161/62 (BP Location: Right Arm)   Pulse 67   Temp 97.9 F (36.6 C) (Oral)   Resp 18   Ht 5\' 6"  (1.676 m)   Wt 156 lb 1.6 oz (70.8 kg)   SpO2 96%   BMI 25.20 kg/m  Room air  General: Awake. No distress. No family at bedside.  Integument: Warm &dry. No rash on exposed skin.  HEENT: Moist mucus membranes. No oral ulcers. No scleral icterus. Cardiovascular: Regular rate & rhythm. No edema. No appreciable JVD.  Pulmonary: Referred breath sounds on the right. Clear on left. Some effort after ambulation but no desaturation  Neurological: Alert and oriented 4. No meningismus. Grossly nonfocal.  Labs at discharge Lab Results  Component  Value Date   CREATININE 0.94 09/28/2016   BUN 12 09/28/2016   NA 139 09/28/2016   K 3.7 09/28/2016   CL 103 09/28/2016   CO2 25 09/28/2016   Lab Results  Component Value Date   WBC 8.3 09/28/2016   HGB 12.0 09/28/2016   HCT 36.0 09/28/2016   MCV 92.8 09/28/2016   PLT 182 09/28/2016   Lab Results    Component Value Date   ALT 33 09/26/2016   AST 30 09/26/2016   ALKPHOS 52 09/26/2016   BILITOT 0.8 09/26/2016   No results found for: INR, PROTIME  Current radiology studies Dg Chest 2 View  Result Date: 09/28/2016 CLINICAL DATA:  Aspiration pneumonia. EXAM: CHEST  2 VIEW COMPARISON:  09/24/2016. FINDINGS: Interim extubation. Prior right pneumonectomy. Interim partial clearing of left base infiltrate. No pleural effusion or pneumothorax. No acute bony abnormality identified. IMPRESSION: 1. Interim extubation.  Prior right pneumonectomy. 2. Interim partial clearing of left base infiltrate. Electronically Signed   By: Marcello Moores  Register   On: 09/28/2016 07:22    Disposition:  07-Left Against Medical Advice/Left Without Being Seen/Elopement  Discharge Instructions    Diet - low sodium heart healthy    Complete by:  As directed    Increase activity slowly    Complete by:  As directed        Medication List    TAKE these medications   EPINEPHrine 0.3 mg/0.3 mL Devi Commonly known as:  EPIPEN Inject 0.3 mLs (0.3 mg total) into the muscle as needed.   levofloxacin 750 MG tablet Commonly known as:  LEVAQUIN Take 1 tablet (750 mg total) by mouth daily. Start taking on:  09/29/2016   pantoprazole 40 MG tablet Commonly known as:  PROTONIX Take 1 tablet (40 mg total) by mouth 2 (two) times daily.      Follow-up Information    Amy Trellis Paganini, PA-C Follow up on 10/15/2016.   Specialty:  Gastroenterology Why:  at 930am  Contact information: [place] [address] [phone]        Rexene Edison, NP Follow up on 10/05/2016.   Specialty:  Pulmonary Disease Why:  at 930 am   Contact information: [address] [place] [address] [phone]           Discharged Condition: good  Physician Statement:   The Patient was personally examined, the discharge assessment and plan has been personally reviewed and I agree with ACNP Babcock's assessment  and plan. > 30 minutes of time have been dedicated to discharge assessment, planning and discharge instructions.   Signed: Clementeen Graham 09/28/2016, 10:05 AM  Patient seen and examined, agree with above note.  I dictated the care and orders written for this patient under my direction.  Rush Farmer, MD [phone]

## 2016-09-28 NOTE — Progress Notes (Signed)
Reviewed discharge information/medications with patient.  Answered all of her questions.  Patient is waiting on her brother to pick her up.

## 2016-09-29 LAB — CULTURE, BLOOD (ROUTINE X 2)
CULTURE: NO GROWTH
CULTURE: NO GROWTH

## 2016-10-05 ENCOUNTER — Ambulatory Visit (INDEPENDENT_AMBULATORY_CARE_PROVIDER_SITE_OTHER)
Admission: RE | Admit: 2016-10-05 | Discharge: 2016-10-05 | Disposition: A | Discharge status: Home / Self Care | Payer: Medicare Other | Source: Ambulatory Visit | Attending: Adult Health | Admitting: Adult Health

## 2016-10-05 ENCOUNTER — Telehealth: Payer: Self-pay | Admitting: Adult Health

## 2016-10-05 ENCOUNTER — Ambulatory Visit (INDEPENDENT_AMBULATORY_CARE_PROVIDER_SITE_OTHER): Payer: Medicare Other | Admitting: Adult Health

## 2016-10-05 ENCOUNTER — Encounter (HOSPITAL_COMMUNITY): Payer: Self-pay | Admitting: Radiology

## 2016-10-05 ENCOUNTER — Encounter: Payer: Self-pay | Admitting: Adult Health

## 2016-10-05 ENCOUNTER — Encounter (HOSPITAL_COMMUNITY): Payer: Self-pay | Admitting: Cardiology

## 2016-10-05 ENCOUNTER — Other Ambulatory Visit (INDEPENDENT_AMBULATORY_CARE_PROVIDER_SITE_OTHER): Payer: Medicare Other

## 2016-10-05 ENCOUNTER — Ambulatory Visit (HOSPITAL_COMMUNITY)
Admission: RE | Admit: 2016-10-05 | Discharge: 2016-10-05 | Disposition: A | Discharge status: Home / Self Care | Payer: Medicare Other | Source: Ambulatory Visit | Attending: Cardiovascular Disease | Admitting: Cardiovascular Disease

## 2016-10-05 VITALS — BP 122/64 | HR 73 | Ht 66.0 in | Wt 158.6 lb

## 2016-10-05 DIAGNOSIS — J189 Pneumonia, unspecified organism: Secondary | ICD-10-CM

## 2016-10-05 DIAGNOSIS — K2100 Gastro-esophageal reflux disease with esophagitis, without bleeding: Secondary | ICD-10-CM

## 2016-10-05 DIAGNOSIS — R1310 Dysphagia, unspecified: Secondary | ICD-10-CM

## 2016-10-05 DIAGNOSIS — J690 Pneumonitis due to inhalation of food and vomit: Secondary | ICD-10-CM

## 2016-10-05 DIAGNOSIS — I82492 Acute embolism and thrombosis of other specified deep vein of left lower extremity: Secondary | ICD-10-CM | POA: Insufficient documentation

## 2016-10-05 DIAGNOSIS — M7989 Other specified soft tissue disorders: Secondary | ICD-10-CM | POA: Insufficient documentation

## 2016-10-05 DIAGNOSIS — R1319 Other dysphagia: Secondary | ICD-10-CM

## 2016-10-05 LAB — CBC WITH DIFFERENTIAL/PLATELET
BASOS ABS: 0 10*3/uL (ref 0.0–0.1)
Basophils Relative: 0.4 % (ref 0.0–3.0)
EOS ABS: 0.1 10*3/uL (ref 0.0–0.7)
Eosinophils Relative: 0.9 % (ref 0.0–5.0)
HCT: 37.5 % (ref 36.0–46.0)
Hemoglobin: 12.8 g/dL (ref 12.0–15.0)
LYMPHS ABS: 1.4 10*3/uL (ref 0.7–4.0)
Lymphocytes Relative: 17.4 % (ref 12.0–46.0)
MCHC: 34.1 g/dL (ref 30.0–36.0)
MCV: 91.9 fl (ref 78.0–100.0)
MONOS PCT: 7.3 % (ref 3.0–12.0)
Monocytes Absolute: 0.6 10*3/uL (ref 0.1–1.0)
NEUTROS ABS: 5.9 10*3/uL (ref 1.4–7.7)
NEUTROS PCT: 74 % (ref 43.0–77.0)
PLATELETS: 299 10*3/uL (ref 150.0–400.0)
RBC: 4.08 Mil/uL (ref 3.87–5.11)
RDW: 13.1 % (ref 11.5–15.5)
WBC: 7.9 10*3/uL (ref 4.0–10.5)

## 2016-10-05 MED ORDER — RIVAROXABAN (XARELTO) VTE STARTER PACK (15 & 20 MG): ORAL_TABLET | ORAL | 0 refills | Status: DC

## 2016-10-05 NOTE — Telephone Encounter (Signed)
Spoke with Varney Biles at [facility] [place] department with a call report on patient- [place] was positive for DVT in L leg.  TP please advise.  Thanks!

## 2016-10-05 NOTE — Progress Notes (Signed)
Positive for DVT in left peroneal vein.  Discussed with Caryl Pina at Pulmonary

## 2016-10-05 NOTE — Assessment & Plan Note (Signed)
Cont on PPI  Follow up with GI  Aspiration precautions

## 2016-10-05 NOTE — Addendum Note (Signed)
Addended by: Melvenia Needles on: 10/05/2016 05:01 PM   Modules accepted: Orders

## 2016-10-05 NOTE — Progress Notes (Signed)
Chart and office note reviewed in detail  > agree with a/p as outlined    

## 2016-10-05 NOTE — Assessment & Plan Note (Signed)
Aspiration precuation  follow up with GI

## 2016-10-05 NOTE — Progress Notes (Signed)
Subjective:    Patient ID: Erika Walker, female    DOB: 1946-05-14, 70 y.o.   MRN: [id number]  HPI 70 yo female seen for PCCM consult during hospitalization 09/24/16 for aspiration PNA requiring vent support   10/05/2016 [facility] follow up  Patient presents for a one-week post hospital follow-up. Hospital records from [facility] reviewed in detail. Patient has a history of chest trauma with trach 25 years ago required a right pneumonectomy with prolonged illness /Decannulation. Sequelae with dysphagia/choking . On day of admission. Patient was eating and started to choke. EMS was called due to respiratory distress. Patient did require resuscitation with positive pressure bag mask ventilation and suctioning. Patient has significant inspiratory stridor. She required emergent intubation. Patient required vent support. She was treated with antibiotics for aspiration. Patient was seen by ENT and GI. Patient underwent a endoscopy by Dr. Fuller Plan on December 8 that showed esophageal stenosis, reflux esophagitis and a small hiatal hernia. Patient was recommended for outpatient follow-up and consider dilatation. Patient was seen by ENT and extubated in the OR. This did not show any evidence of subglottic stenosis. The larynx appeared normal, but swollen around the level of the cords. He was seen by speech pathologist recommended on a D2 diet with thin liquids  She was recommended to continue on a protein pump inhibitor twice daily. She was discharged on Levaquin. His discharge. Patient is feeling much better, Feels she is getting stronger . Less dyspnea no fever, discolored mucus , or hemoptysis.  No choking episodes since discharge.  Did notice left leg swelling since discharge and soreness in calves . No chest pain, dyspnea, hemoptysis or syncope.   X-ray today shows resolution of Left sided PNA.    Past Medical History:  Diagnosis Date  . HLD (hyperlipidemia)    Current Outpatient  Prescriptions on File Prior to Visit  Medication Sig Dispense Refill  . EPINEPHrine (EPIPEN) 0.3 mg/0.3 mL DEVI Inject 0.3 mLs (0.3 mg total) into the muscle as needed. (Patient not taking: Reported on 09/24/2016) 2 Device 1  . pantoprazole (PROTONIX) 40 MG tablet Take 1 tablet (40 mg total) by mouth 2 (two) times daily. 60 tablet 6   No current facility-administered medications on file prior to visit.       Review of Systems Constitutional:   No  weight loss, night sweats,  Fevers, chills, fatigue, or  lassitude.  HEENT:   No headaches,  Difficulty swallowing,  Tooth/dental problems, or  Sore throat,                No sneezing, itching, ear ache, nasal congestion, post nasal drip,   CV:  No chest pain,  Orthopnea, PND,   anasarca, dizziness, palpitations, syncope.   GI  No heartburn, indigestion, abdominal pain, nausea, vomiting, diarrhea, change in bowel habits, loss of appetite, bloody stools.   Resp: No shortness of breath with exertion or at rest.  No excess mucus, no productive cough,  No non-productive cough,  No coughing up of blood.  No change in color of mucus.  No wheezing.  No chest wall deformity  Skin: no rash or lesions.  GU: no dysuria, change in color of urine, no urgency or frequency.  No flank pain, no hematuria   MS:  No joint pain or swelling.  No decreased range of motion.  No back pain.  Psych:  No change in mood or affect. No depression or anxiety.  No memory loss.  Objective:   Physical Exam Vitals:   10/05/16 0948  BP: 122/64  Pulse: 73  SpO2: 98%  Weight: 158 lb 9.6 oz (71.9 kg)  Height: 5\' 6"  (1.676 m)   GEN: A/Ox3; pleasant , NAD, well nourished    HEENT:  [place]/AT,  EACs-clear, TMs-wnl, NOSE-clear, THROAT-clear, no lesions, no postnasal drip or exudate noted.   NECK:  Supple w/ fair ROM; no JVD; normal carotid impulses w/o bruits; no thyromegaly or nodules palpated; no lymphadenopathy.    RESP  Clear  P & A; w/o, wheezes/ rales/ or  rhonchi. no accessory muscle use, no dullness to percussion  CARD:  RRR, no m/r/g  , tr to 1 + L>R  peripheral edema, pulses intact, no cyanosis or clubbing.neg homans sign , on calf tenderness.   GI:   Soft & nt; nml bowel sounds; no organomegaly or masses detected.   Musco: Warm bil, no deformities or joint swelling noted.   Neuro: alert, no focal deficits noted.    Skin: Warm, no lesions or rashes  Shawntia Mangal NP-C  [facility]  10/05/2016         Assessment & Plan:

## 2016-10-05 NOTE — Addendum Note (Signed)
Addended by: Parke Poisson E on: 10/05/2016 04:53 PM   Modules accepted: Orders

## 2016-10-05 NOTE — Patient Instructions (Addendum)
Happy Birthday !! Your Pneumonia has resolved.  Continue with follow up with GI specialist .  Continue with Aspiration precautions.  We are setting you up for a venous doppler.  follow up with pulmonary As needed   Please contact office for sooner follow up if symptoms do not improve or worsen or seek emergency care  Begin Xarelto starter pack as discussed  Labs today  Do not take any type of NSAID -Advil , motrin, aleve, aspirin , etc.  Call for any sign of bleeding . Follow up in 3 weeks and As needed   .

## 2016-10-05 NOTE — Assessment & Plan Note (Signed)
Aspiration PNA after choking episode (requring vent support)  She is doing much better after abx.  cXR today shows clearance   Plan  Patient Instructions  Happy Rudene Anda !! Your Pneumonia has resolved.  Continue with follow up with GI specialist .  Continue with Aspiration precautions.  We are setting you up for a venous doppler.  follow up with pulmonary As needed   Please contact office for sooner follow up if symptoms do not improve or worsen or seek emergency care

## 2016-10-06 ENCOUNTER — Telehealth: Payer: Self-pay | Admitting: Adult Health

## 2016-10-06 MED ORDER — RIVAROXABAN 20 MG PO TABS: 20.0000 mg | ORAL_TABLET | Freq: Every day | ORAL | 5 refills | Status: DC

## 2016-10-06 MED ORDER — RIVAROXABAN (XARELTO) VTE STARTER PACK (15 & 20 MG): ORAL_TABLET | ORAL | 0 refills | Status: DC

## 2016-10-06 NOTE — Telephone Encounter (Signed)
Called and spoke to pt. Pt is requesting the Xarelto starter pack be sent to [facility] instead of [facility] due to [place] and was advised they have all the information needed for pt to get a voucher for the [employer] pack, rx sent to [place]. Pt verbalized understanding and denied any further questions or concerns at this time.

## 2016-10-06 NOTE — Telephone Encounter (Signed)
Daneil Dan took care of this message, see previous.

## 2016-10-15 ENCOUNTER — Ambulatory Visit: Payer: Self-pay | Admitting: Physician Assistant

## 2016-10-22 ENCOUNTER — Encounter: Payer: Self-pay | Admitting: Physician Assistant

## 2016-10-22 ENCOUNTER — Ambulatory Visit (INDEPENDENT_AMBULATORY_CARE_PROVIDER_SITE_OTHER): Payer: Medicare Other | Admitting: Physician Assistant

## 2016-10-22 VITALS — BP 124/70 | HR 78 | Ht 66.0 in | Wt 155.6 lb

## 2016-10-22 DIAGNOSIS — T18128A Food in esophagus causing other injury, initial encounter: Secondary | ICD-10-CM | POA: Diagnosis not present

## 2016-10-22 DIAGNOSIS — R933 Abnormal findings on diagnostic imaging of other parts of digestive tract: Secondary | ICD-10-CM | POA: Diagnosis not present

## 2016-10-22 NOTE — Patient Instructions (Signed)
You have been scheduled for a Barium Esophogram at [facility] (1st floor of the hospital) on Monday 10-26-2016 at 10:30 am. Please arrive at 10: 15 AM to your appointment for registration. Make certain not to have anything to eat or drink 3 hours prior to your test. If you need to reschedule for any reason, please contact radiology at [phone] to do so. __________________________________________________________________ A barium swallow is an examination that concentrates on views of the esophagus. This tends to be a double contrast exam (barium and two liquids which, when combined, create a gas to distend the wall of the oesophagus) or single contrast (non-ionic iodine based). The study is usually tailored to your symptoms so a good history is essential. Attention is paid during the study to the form, structure and configuration of the esophagus, looking for functional disorders (such as aspiration, dysphagia, achalasia, motility and reflux) EXAMINATION You may be asked to change into a gown, depending on the type of swallow being performed. A radiologist and radiographer will perform the procedure. The radiologist will advise you of the type of contrast selected for your procedure and direct you during the exam. You will be asked to stand, sit or lie in several different positions and to hold a small amount of fluid in your mouth before being asked to swallow while the imaging is performed .In some instances you may be asked to swallow barium coated marshmallows to assess the motility of a solid food bolus. The exam can be recorded as a digital or video fluoroscopy procedure. POST PROCEDURE It will take 1-2 days for the barium to pass through your system. To facilitate this, it is important, unless otherwise directed, to increase your fluids for the next 24-48hrs and to resume your normal diet.  This test typically takes about 30 minutes to  perform. __________________________________________________________________________________

## 2016-10-22 NOTE — Progress Notes (Signed)
Switch to Dr. Silverio Decamp is OK with me if it is OK with her

## 2016-10-22 NOTE — Progress Notes (Signed)
Subjective:    Patient ID: Erika Walker, female    DOB: 18-May-1946, 71 y.o.   MRN: [id number]  HPI  Erika Walker is a pleasant 71 year old white female known previously to Dr. Delfin Edis who had established with Dr. Ardis Hughs. She comes in today as a post hospital follow-up after recent hospitalization 09/24/2016 after she had an acute episode of choking on a piece of chicken with subsequent food impaction and then aspiration. She developed acute respiratory failure and required intubation for an aspiration pneumonitis.  Patient has very remote history of a right pneumonectomy and prior trach approximately 25 years ago. Patient was seen during her hospitalization by Dr. Fuller Plan and had EGD on 09/25/2016 with finding of a grade B esophagitis and a GE junction stricture first with the scope and felt to be about 1.3 cm in diameter. He had a food impaction which was removed.  Prior to that she had undergone CT of the chest on admission that showed dilated esophagus full of food and debris throughout, extending to the level of the thoracic inlet. Patient was told that she would need esophageal dilation. She comes in today to discuss. She says she has not had any dysphagia symptoms since discharge from the hospital. She states she is being very careful about cutting her food up very small and chewing very carefully. Interestingly patient had also had EGD by Dr. Ardis Hughs done in March 2017 and This was done for complaints of intermittent dysphagia and choking. She was not found to have an esophageal stricture and it was not felt that her symptoms were secondary to her esophagus that rather altered anatomy of her vocal cords. Up remotely patient had EGD in 2014 per Dr. Olevia Perches with dilation of the upper esophageal sphincter per [place] which did improve her symptoms.  She is afraid of choking again and aspirating and would like her esophagus dilated if  possible. She also mentions that she would like to be established  with Dr. Silverio Decamp, rather than Dr. Ardis Hughs.  Review of Systems Pertinent positive and negative review of systems were noted in the above HPI section.  All other review of systems was otherwise negative.  Outpatient Encounter Prescriptions as of 10/22/2016  Medication Sig  . EPINEPHrine (EPIPEN) 0.3 mg/0.3 mL DEVI Inject 0.3 mLs (0.3 mg total) into the muscle as needed.  . pantoprazole (PROTONIX) 40 MG tablet Take 1 tablet (40 mg total) by mouth 2 (two) times daily.  . Rivaroxaban (XARELTO STARTER PACK) 15 & 20 MG TBPK Take as directed on package: Start with one 15mg  tablet by mouth twice a day with food. On Day 22, switch to one 20mg  tablet once a day  . rivaroxaban (XARELTO) 20 MG TABS tablet Take 1 tablet (20 mg total) by mouth daily with supper.   No facility-administered encounter medications on file as of 10/22/2016.    No Known Allergies Patient Active Problem List   Diagnosis Date Noted  . Aspiration pneumonia ([place]) 10/05/2016  . Leg swelling 10/05/2016  . Esophageal stricture   . Gastroesophageal reflux disease with esophagitis   . Acute respiratory failure ([place]) 09/24/2016  . Stridor   . Paroxysmal dyspnea 08/20/2013  . Esophageal dysphagia 07/19/2013  . H/O pneumonectomy 03/31/2010   Social History   Social History  . Marital status: Married    Spouse name: N/A  . Number of children: 3  . Years of education: N/A   Occupational History  . realtor [employer]   Social History Main Topics  .  Smoking status: Never Smoker  . Smokeless tobacco: Never Used  . Alcohol use No  . Drug use: No  . Sexual activity: Yes    Birth control/ protection: Post-menopausal   Other Topics Concern  . Not on file   Social History Narrative  . No narrative on file    Erika Walker's family history includes Diabetes in her brother; Liver cancer in her brother.      Objective:    Vitals:   10/22/16 1009  BP: 124/70  Pulse: 78    Physical Exam  well-developed older white  female in no acute distress, appears younger than stated age blood pressure 124/70 pulse 78 height 5 foot 6 weight 155 BMI 25.1. HEENT; nontraumatic normocephalic EOMI PERRLA sclera anicteric, Cardiovascular; regular rate and rhythm with S1-S2 no murmur or gallop, Pulmonary; clear on left absent breath sounds on right. Abdomen; soft nontender nondistended bowel sounds are active there is no palpable mass or hepatosplenomegaly, Extremities; no clubbing cyanosis or edema skin warm and dry, Neuropsych ;mood and affect appropriate       Assessment & Plan:   #67 71 year old white female with recent hospitalization December 2017 with acute hypoxic respiratory failure after aspiration and episode with food impaction. 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 Interestingly patient had CT done on admission that showed a dilated esophagus full of debris raising question of underlying motility disorder or possibly achalasia EGD done March 2017 with no esophageal stricture noted and altered anatomy of vocal cords EGD 2014 per Dr. Delfin Edis with dilation of the upper esophageal sphincter  #2 status post remote right pneumonectomy #3 to diagnosis of DVT made last week. Patient currently on Xarelto, plans to follow-up with pulmonary tomorrow  Plan; Am concerned that patient may have an underlying motility disorder contributing to her choking episodes. Given new diagnosis of DVT almond now on Xarelto would also like to delay EGD and dilation of the esophagus for 3-6 months. Will schedule for barium swallow with tablet to assess for any residual esophageal dilation etc. Patient expresses desire to be established with Dr. Silverio Decamp. I will confer with Dr. Silverio Decamp and Dr. Thornton Dales copy this note to both) Further plans pending results of barium swallow.    Kysa Calais S  Amiria Orrison PA-C 10/22/2016   Cc: Binnie Rail, MD

## 2016-10-23 ENCOUNTER — Ambulatory Visit (INDEPENDENT_AMBULATORY_CARE_PROVIDER_SITE_OTHER): Payer: Medicare Other | Admitting: Adult Health

## 2016-10-23 ENCOUNTER — Encounter: Payer: Self-pay | Admitting: Adult Health

## 2016-10-23 DIAGNOSIS — K2100 Gastro-esophageal reflux disease with esophagitis, without bleeding: Secondary | ICD-10-CM

## 2016-10-23 DIAGNOSIS — I82402 Acute embolism and thrombosis of unspecified deep veins of left lower extremity: Secondary | ICD-10-CM | POA: Diagnosis not present

## 2016-10-23 DIAGNOSIS — I82409 Acute embolism and thrombosis of unspecified deep veins of unspecified lower extremity: Secondary | ICD-10-CM | POA: Insufficient documentation

## 2016-10-23 DIAGNOSIS — J690 Pneumonitis due to inhalation of food and vomit: Secondary | ICD-10-CM | POA: Diagnosis not present

## 2016-10-23 NOTE — Assessment & Plan Note (Signed)
Provoked DVT in left lower extremity( peroneal ) in pt with recent critical illness. No previous hx of DVT  Should be able to treat for 3 months . Check venous doppler on return in 3 months ,if resolved can consider d/c xarelto .   Plan  Patient Instructions  Continue on Xarelto as directed. Transition from starter pack to Xarelto 20mg  daily on day 22.  Set up Venous Doppler in 3 months (1 week prior to next office visit)  Follow up with Dr. Melvyn Novas  In 3 months and As needed

## 2016-10-23 NOTE — Assessment & Plan Note (Signed)
Cont on GERD rx  Cont w/ aspiration precautions and GI follow up

## 2016-10-23 NOTE — Progress Notes (Signed)
@Patient  ID: Erika Walker, female    DOB: [birth year], 71 y.o.   MRN: [id number]  Chief Complaint  Patient presents with  . Follow-up    DVT     Referring provider: Binnie Rail, MD  HPI: 71 yo female seen for PCCM consult during hospitalization 09/24/16 for aspiration PNA requiring vent support (after choking episode)  Hx of Chest trauma ~age 51 s/p right pneumonectomy with prolonged illness/decannulation sequeale with dysphageia/choking.   TEST [facility] San  12.2017 endoscopy by Dr. Fuller Plan on December 8 that showed esophageal stenosis, reflux esophagitis and a small hiatal hernia 09/2016 speech pathologist recommended on a D2 diet with thin liquids 09/2016  ENT and extubated in the OR. This did not show any evidence of subglottic stenosis. The larynx appeared normal, but swollen around the level of the cords.  CXR 10/05/16 resolution of Left sided PNA  10/05/16 DVT on acute Left DVT -peroneal vein started on Xarelto   10/23/2016 Follow up : PNA/DVT  Pt returns for 2 week follow up . She was seen last visit for post hospital visit for aspiration PNA after choking episode requiring vent support. She was improved with resolution on CXR . Breathing has returned to baseline with no residual cough or dyspnea.   Last ov noted some left leg swelling , no chest pain or dyspnea. Venous doppler revealed an acute left peroneal DVT . She has had no hx of DVT/VTE.  She was started on Xarelto w/ initial starter pack and xarelto 20mg  to followed on day 22. CBC was nml. She says she is doing well with resolved leg swelling in left leg. No calf pian, hemoptyis or known bleeding .  No chest pain, dyspnea.   Swallowing has been good with no choking episodes. She was seen in GI yesterday with plans for barrium swallow next week. Will postpone endopscopy until later as on xarelto.     No Known Allergies  Immunization History  Administered Date(s) Administered  . Influenza Split 07/22/2011,  08/18/2011, 08/16/2012  . Influenza Whole 09/22/2007, 08/15/2008, 08/12/2009, 08/04/2010  . Influenza, High Dose Seasonal PF 07/21/2016  . Influenza,inj,Quad PF,36+ Mos 08/08/2013, 07/17/2014, 08/02/2015  . Pneumococcal Conjugate-13 10/03/2015  . Pneumococcal Polysaccharide-23 08/25/2007, 08/30/2012  . Zoster 07/22/2011    Past Medical History:  Diagnosis Date  . HLD (hyperlipidemia)     Tobacco History: History  Smoking Status  . Never Smoker  Smokeless Tobacco  . Never Used   Counseling given: Not Answered   Outpatient Encounter Prescriptions as of 10/23/2016  Medication Sig  . EPINEPHrine (EPIPEN) 0.3 mg/0.3 mL DEVI Inject 0.3 mLs (0.3 mg total) into the muscle as needed.  . pantoprazole (PROTONIX) 40 MG tablet Take 1 tablet (40 mg total) by mouth 2 (two) times daily.  . Rivaroxaban (XARELTO STARTER PACK) 15 & 20 MG TBPK Take as directed on package: Start with one 15mg  tablet by mouth twice a day with food. On Day 22, switch to one 20mg  tablet once a day  . rivaroxaban (XARELTO) 20 MG TABS tablet Take 1 tablet (20 mg total) by mouth daily with supper.   No facility-administered encounter medications on file as of 10/23/2016.      Review of Systems  Constitutional:   No  weight loss, night sweats,  Fevers, chills, fatigue, or  lassitude.  HEENT:   No headaches,  Difficulty swallowing,  Tooth/dental problems, or  Sore throat,  No sneezing, itching, ear ache, nasal congestion, post nasal drip,   CV:  No chest pain,  Orthopnea, PND, swelling in lower extremities, anasarca, dizziness, palpitations, syncope.   GI  No heartburn, indigestion, abdominal pain, nausea, vomiting, diarrhea, change in bowel habits, loss of appetite, bloody stools.   Resp: No shortness of breath with exertion or at rest.  No excess mucus, no productive cough,  No non-productive cough,  No coughing up of blood.  No change in color of mucus.  No wheezing.  No chest wall deformity  Skin: no  rash or lesions.  GU: no dysuria, change in color of urine, no urgency or frequency.  No flank pain, no hematuria   MS:  No joint pain or swelling.  No decreased range of motion.  No back pain.    Physical Exam  BP 130/74 (BP Location: Left Arm, Cuff Size: Normal)   Pulse 79   Ht 5\' 6"  (1.676 m)   Wt 155 lb (70.3 kg)   SpO2 98%   BMI 25.02 kg/m   GEN: A/Ox3; pleasant , NAD, well nourished    HEENT:  [place]/AT,  EACs-clear, TMs-wnl, NOSE-clear, THROAT-clear, no lesions, no postnasal drip or exudate noted.   NECK:  Supple w/ fair ROM; no JVD; normal carotid impulses w/o bruits; no thyromegaly or nodules palpated; no lymphadenopathy.    RESP  Clear  P & A; w/o, wheezes/ rales/ or rhonchi. no accessory muscle use, no dullness to percussion  CARD:  RRR, no m/r/g, no peripheral edema, pulses intact, no cyanosis or clubbing. Resolved leg swelling, no calf tenderness   GI:   Soft & nt; nml bowel sounds; no organomegaly or masses detected.   Musco: Warm bil, no deformities or joint swelling noted.   Neuro: alert, no focal deficits noted.    Skin: Warm, no lesions or rashes  Psych:  No change in mood or affect. No depression or anxiety.  No memory loss.  Lab Results:  CBC    Component Value Date/Time   WBC 7.9 10/05/2016 1704   RBC 4.08 10/05/2016 1704   HGB 12.8 10/05/2016 1704   HCT 37.5 10/05/2016 1704   PLT 299.0 10/05/2016 1704   MCV 91.9 10/05/2016 1704   MCH 30.9 09/28/2016 0525   MCHC 34.1 10/05/2016 1704   RDW 13.1 10/05/2016 1704   LYMPHSABS 1.4 10/05/2016 1704   MONOABS 0.6 10/05/2016 1704   EOSABS 0.1 10/05/2016 1704   BASOSABS 0.0 10/05/2016 1704    BMET    Component Value Date/Time   NA 139 09/28/2016 0525   K 3.7 09/28/2016 0525   CL 103 09/28/2016 0525   CO2 25 09/28/2016 0525   GLUCOSE 108 (H) 09/28/2016 0525   BUN 12 09/28/2016 0525   CREATININE 0.94 09/28/2016 0525   CALCIUM 8.6 (L) 09/28/2016 0525   GFRNONAA >60 09/28/2016 0525   GFRAA >60  09/28/2016 0525    BNP No results found for: BNP  ProBNP    Component Value Date/Time   PROBNP 193.6 (H) 06/20/2013 1949    Imaging: Dg Chest 2 View  Result Date: 10/05/2016 CLINICAL DATA:  Pneumonia.  Prior right pneumonectomy. EXAM: CHEST  2 VIEW COMPARISON:  09/28/2016 FINDINGS: Right pneumonectomy is again observed with shift of mediastinal structures to the right. Posttraumatic legs in the right hemithorax. Old healed multiple left-sided rib fractures. Old healed left midclavicular fracture. Stable left apical pleuroparenchymal scarring. Prior left lower lobe airspace opacity appears to have cleared. Levoconvex upper and dextroconvex lower  thoracic scoliosis. IMPRESSION: 1. Interval clearing of the left lower lobe airspace opacity. 2. Right pneumonectomy, with healed posttraumatic deformities of the bony thorax. 3. Stable mild scoliosis. Electronically Signed   By: Van Clines M.D.   On: 10/05/2016 10:13   Dg Chest 2 View  Result Date: 09/28/2016 CLINICAL DATA:  Aspiration pneumonia. EXAM: CHEST  2 VIEW COMPARISON:  09/24/2016. FINDINGS: Interim extubation. Prior right pneumonectomy. Interim partial clearing of left base infiltrate. No pleural effusion or pneumothorax. No acute bony abnormality identified. IMPRESSION: 1. Interim extubation.  Prior right pneumonectomy. 2. Interim partial clearing of left base infiltrate. Electronically Signed   By: Marcello Moores  Register   On: 09/28/2016 07:22   Ct Soft Tissue Neck Wo Contrast  Result Date: 09/24/2016 CLINICAL DATA:  Difficulty breathing. Question subglottic stenosis. Prior pneumonectomy. Patient choked on chicken bone. EXAM: CT NECK WITHOUT CONTRAST TECHNIQUE: Multidetector CT imaging of the neck was performed following the standard protocol without intravenous contrast. COMPARISON:  CT chest today.  Chest x-ray today. FINDINGS: Pharynx and larynx: Endotracheal tube passes lateral to the tongue on the left displacing the tongue to the  right. No tongue mass is identified. Endotracheal tube is in good position in the mid trachea. The balloon is inflated. No subglottic stenosis identified. No retained foreign body air chicken bone is present. The esophagus is dilated and filled with food and air-fluid level. The esophagus is present to the right of midline related to prior pneumonectomy. Salivary glands: Negative Thyroid: Negative Lymph nodes: No enlarged or pathologic lymph nodes. Vascular: Vascular patency not evaluated without intravenous contrast. Limited intracranial: Negative Visualized orbits: Negative Mastoids and visualized paranasal sinuses: Mild mucosal edema left maxillary sinus. Remaining sinuses clear. Skeleton: Cervical disc degeneration and mild spurring. No acute skeletal abnormality. Upper chest: Right pneumonectomy. Dilated esophagus with air-fluid level and retained food. Mild patchy infiltrate left upper lobe. Chest CT reported separately Other: None IMPRESSION: Endotracheal tube in the midtrachea.  No subglottic stenosis. No retained foreign body or chicken bone. Dilated esophagus with air-fluid level in retained food. Negative for mass lesion. Electronically Signed   By: Franchot Gallo M.D.   On: 09/24/2016 17:23   Ct Chest Wo Contrast  Result Date: 09/24/2016 CLINICAL DATA:  Difficulty breathing. Question subglottic stenosis. Prior pneumonectomy. Possible choking. EXAM: CT CHEST WITHOUT CONTRAST TECHNIQUE: Multidetector CT imaging of the chest was performed following the standard protocol without IV contrast. COMPARISON:  Plain films of earlier today.  No prior CT. FINDINGS: Cardiovascular: Aortic and branch vessel atherosclerosis. Mediastinal shift to the right. Cardiomegaly. No pericardial effusion. probable left main coronary artery atherosclerosis. Right coronary artery atherosclerosis. Mediastinum/Nodes: No supraclavicular adenopathy. limited evaluation for thoracic adenopathy secondary to anatomic distortion and  lack of IV contrast. No gross adenopathy identified. The esophagus is dilated and debris filled throughout. This extends to the level of the thoracic inlet, including on image 33/series 3. Lungs/Pleura: No pleural fluid. Status post right-sided pneumonectomy, without air in the pneumonectomy bed. Degradation secondary to patient arm position and mild motion. No other radiopaque foreign body identified within the trachea. Hyperexpansion of the left lung. Dependent lower lobe predominant patchy airspace and ground-glass opacity with septal thickening. Upper Abdomen: Degradation continuing into the upper abdomen, including secondary to EKG lead artifact. Grossly normal noncontrast appearance of the liver, spleen, stomach, adrenal glands, kidneys. Musculoskeletal: Presumably posttraumatic defects of right-sided ribs. IMPRESSION: 1. Status post right pneumonectomy. 2. Basilar and dependent predominant airspace and ground-glass opacity throughout the left lung. Favor aspiration. Infection could  look similar. 3. Dilated esophagus with debris throughout. This suggests severe dysmotility and/or gastroesophageal reflux disease. Patient is predisposed all high at to aspiration. 4. Cardiomegaly. Coronary artery atherosclerosis. Aortic atherosclerosis. 5. Moderate degradation, as above Electronically Signed   By: Abigail Miyamoto M.D.   On: 09/24/2016 17:36   Dg Chest Port 1 View  Result Date: 09/24/2016 CLINICAL DATA:  Initial evaluation for endotracheal tube adjustment. EXAM: PORTABLE CHEST 1 VIEW COMPARISON:  Prior studies from earlier same day. FINDINGS: Patient remains intubated with the tip of the endotracheal tube positioned approximately 5 cm above the carina. Cardiomediastinal silhouette not well evaluated. Right hemithorax remains largely opacified. Patchy opacity and probable vascular congestion again noted within the left lung base. No pneumothorax. Osseous structures unchanged. IMPRESSION: 1. Tip of the endotracheal  tube approximately 5 cm above the carina. 2. Otherwise little interval change in appearance of the chest. Electronically Signed   By: Jeannine Boga M.D.   On: 09/24/2016 18:17   Dg Chest Portable 1 View  Result Date: 09/24/2016 CLINICAL DATA:  Post intubation EXAM: PORTABLE CHEST 1 VIEW COMPARISON:  09/24/2016 FINDINGS: Endotracheal tube is 6 cm above the carina. Complete opacification of the right hemithorax from prior pneumonectomy. Patchy airspace disease in the left lower lobe. Cannot exclude pneumonia or aspiration. Heart is likely normal size. IMPRESSION: New patchy airspace disease in the left lower lobe concerning for pneumonia. Cannot exclude aspiration. Prior right pneumonectomy Electronically Signed   By: Rolm Baptise M.D.   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 Mediastinal shift to the right is stable. The left lung is clear and somewhat hyperaerated. Heart size is difficult to assess. No bony abnormality is seen. 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 The left lung is clear. Electronically Signed   By: Ivar Drape M.D.   On: 09/24/2016 14:49     Assessment & Plan:   No problem-specific Assessment & Plan notes found for this encounter.     Rexene Edison, NP 10/23/2016

## 2016-10-23 NOTE — Addendum Note (Signed)
Addended by: Len Blalock on: 10/23/2016 11:13 AM   Modules accepted: Orders

## 2016-10-23 NOTE — Patient Instructions (Addendum)
Continue on Xarelto as directed. Transition from starter pack to Xarelto 20mg  daily on day 22.  Set up Venous Doppler in 3 months (1 week prior to next office visit)  Follow up with Dr. Melvyn Novas  In 3 months and As needed

## 2016-10-23 NOTE — Progress Notes (Signed)
Chart and office note reviewed in detail  > agree with a/p as outlined    

## 2016-10-23 NOTE — Assessment & Plan Note (Signed)
Clinically improved , resolved on cxr last ov  Cont w/ aspiration precautions and GI follow up

## 2016-10-26 ENCOUNTER — Ambulatory Visit (HOSPITAL_COMMUNITY)
Admission: RE | Admit: 2016-10-26 | Discharge: 2016-10-26 | Disposition: A | Discharge status: Home / Self Care | Payer: Medicare Other | Source: Ambulatory Visit | Attending: Physician Assistant | Admitting: Physician Assistant

## 2016-10-26 DIAGNOSIS — K224 Dyskinesia of esophagus: Secondary | ICD-10-CM | POA: Insufficient documentation

## 2016-10-26 DIAGNOSIS — K219 Gastro-esophageal reflux disease without esophagitis: Secondary | ICD-10-CM | POA: Insufficient documentation

## 2016-10-26 DIAGNOSIS — R933 Abnormal findings on diagnostic imaging of other parts of digestive tract: Secondary | ICD-10-CM | POA: Diagnosis not present

## 2016-10-26 DIAGNOSIS — K449 Diaphragmatic hernia without obstruction or gangrene: Secondary | ICD-10-CM | POA: Insufficient documentation

## 2016-10-26 DIAGNOSIS — T18128A Food in esophagus causing other injury, initial encounter: Secondary | ICD-10-CM

## 2016-10-28 NOTE — Progress Notes (Signed)
Reviewed and agree with documentation and assessment and plan. K. Veena Ladeja Pelham , MD   

## 2016-11-02 DIAGNOSIS — H5310 Unspecified subjective visual disturbances: Secondary | ICD-10-CM | POA: Diagnosis not present

## 2016-11-02 DIAGNOSIS — H43811 Vitreous degeneration, right eye: Secondary | ICD-10-CM | POA: Diagnosis not present

## 2016-11-03 ENCOUNTER — Telehealth: Payer: Self-pay | Admitting: Internal Medicine

## 2016-11-03 NOTE — Telephone Encounter (Signed)
Spoke with pt. We did not have the samples she was looking for. She will try back at the end of the week. She still has some days left of her original rx, she stated TP wanted her to receive samples. Nothing further is needed at this time.

## 2016-11-06 ENCOUNTER — Telehealth: Payer: Self-pay | Admitting: Internal Medicine

## 2016-11-06 NOTE — Telephone Encounter (Signed)
We do not have samples of Xarelto at this time. Pt is aware. Nothing further was needed.

## 2016-11-09 ENCOUNTER — Encounter (INDEPENDENT_AMBULATORY_CARE_PROVIDER_SITE_OTHER): Payer: Self-pay

## 2016-11-09 ENCOUNTER — Ambulatory Visit (INDEPENDENT_AMBULATORY_CARE_PROVIDER_SITE_OTHER): Payer: Medicare Other | Admitting: Gastroenterology

## 2016-11-09 ENCOUNTER — Encounter: Payer: Self-pay | Admitting: Gastroenterology

## 2016-11-09 VITALS — BP 150/86 | HR 80 | Ht 65.5 in | Wt 155.2 lb

## 2016-11-09 DIAGNOSIS — K219 Gastro-esophageal reflux disease without esophagitis: Secondary | ICD-10-CM

## 2016-11-09 DIAGNOSIS — K449 Diaphragmatic hernia without obstruction or gangrene: Secondary | ICD-10-CM | POA: Diagnosis not present

## 2016-11-09 DIAGNOSIS — R1310 Dysphagia, unspecified: Secondary | ICD-10-CM | POA: Diagnosis not present

## 2016-11-09 MED ORDER — RANITIDINE HCL 150 MG PO TABS: 150.0000 mg | ORAL_TABLET | Freq: Every day | ORAL | 3 refills | Status: DC

## 2016-11-09 NOTE — Progress Notes (Signed)
Erika LAGORIO    [id number]    09-10-1946  Primary Care Physician:Stacy Lorretta Harp, MD  Referring Physician: Binnie Rail, MD [place], [place] [id number]  Chief complaint:  Discuss results of barium swallow  HPI: 71 year old female previously followed by Dr. Olevia Walker, was last seen by Erika Walker with on January 4 ,2018 is here to establish care and follow-up. She had an episode of food impaction in December 2017 that resulted in aspiration pneumonitis. EGD done at the time of food impaction in December 2017 showed grade B esophagitis and a possible GE junction stricture. Prior to that she had an EGD in March 2017 by Dr. Ardis Walker for evaluation of intermittent dysphagia was unremarkable. She currently has no symptoms. Patient is here to discuss the results of barium esophagram which showed nonspecific esophageal motility disorder with evidence of gastroesophageal reflux. The 54mm barium tablet passed freely into the stomach  Denies any dysphagia, odynophagia, nausea, vomiting, abdominal pain, melena or bright red blood per rectum     Outpatient Encounter Prescriptions as of 11/09/2016  Medication Sig  . rivaroxaban (XARELTO) 20 MG TABS tablet Take 1 tablet (20 mg total) by mouth daily with supper.  Marland Kitchen EPINEPHrine (EPIPEN) 0.3 mg/0.3 mL DEVI Inject 0.3 mLs (0.3 mg total) into the muscle as needed.  . ranitidine (ZANTAC) 150 MG tablet Take 1 tablet (150 mg total) by mouth at bedtime.  . [DISCONTINUED] pantoprazole (PROTONIX) 40 MG tablet Take 1 tablet (40 mg total) by mouth 2 (two) times daily. (Patient not taking: Reported on 11/09/2016)  . [DISCONTINUED] Rivaroxaban (XARELTO STARTER PACK) 15 & 20 MG TBPK Take as directed on package: Start with one 15mg  tablet by mouth twice a day with food. On Day 22, switch to one 20mg  tablet once a day   No facility-administered encounter medications on file as of 11/09/2016.     Allergies as of 11/09/2016  . (No Known Allergies)     Past Medical History:  Diagnosis Date  . HLD (hyperlipidemia)     Past Surgical History:  Procedure Laterality Date  . COLONOSCOPY  2014   Dr Erika Walker  . ESOPHAGOGASTRODUODENOSCOPY N/A 09/25/2016   Procedure: ESOPHAGOGASTRODUODENOSCOPY (EGD);  Surgeon: Erika Artist, MD;  Location: [facility] ENDOSCOPY;  Service: Endoscopy;  Laterality: N/A;  at bedside  . LARYNGOSCOPY AND BRONCHOSCOPY N/A 09/26/2016   Procedure: MICRO LARYNGOSCOPY AND  EXTUBATION;  Surgeon: Erika Gala, MD;  Location: [place];  Service: ENT;  Laterality: N/A;  . lung removed Right 1992   post trauma    Family History  Problem Relation Age of Onset  . Liver cancer Brother   . Diabetes Brother     pre-diabetic  . Colon cancer Neg Hx   . Pancreatic cancer Neg Hx   . Stomach cancer Neg Hx   . Stroke Neg Hx   . Heart disease Neg Hx   . Esophageal cancer Neg Hx     Social History   Social History  . Marital status: Married    Spouse name: N/A  . Number of children: 3  . Years of education: N/A   Occupational History  . realtor [employer]   Social History Main Topics  . Smoking status: Never Smoker  . Smokeless tobacco: Never Used  . Alcohol use No  . Drug use: No  . Sexual activity: Yes    Birth control/ protection: Post-menopausal   Other Topics Concern  .  Not on file   Social History Narrative  . No narrative on file      Review of systems: Review of Systems  Constitutional: Negative for fever and chills.  HENT: Negative.   Eyes: Negative for blurred vision.  Respiratory: Negative for cough, shortness of breath and wheezing.   Cardiovascular: Negative for chest pain and palpitations.  Gastrointestinal: as per HPI Genitourinary: Negative for dysuria, urgency, frequency and hematuria.  Musculoskeletal: Negative for myalgias, back pain and joint pain.  Skin: Negative for itching and rash.  Neurological: Negative for dizziness, tremors, focal weakness, seizures and loss of consciousness.   Endo/Heme/Allergies: Negative for seasonal allergies.  Psychiatric/Behavioral: Negative for depression, suicidal ideas and hallucinations.  All other systems reviewed and are negative.   Physical Exam: Vitals:   11/09/16 0859  BP: (!) 150/86  Pulse: 80   Body mass index is 25.44 kg/m. Gen:      No acute distress HEENT:  EOMI, sclera anicteric Neck:     No masses; no thyromegaly Lungs:    Clear to auscultation bilaterally; normal respiratory effort CV:         Regular rate and rhythm; no murmurs Abd:      + bowel sounds; soft, non-tender; no palpable masses, no distension Ext:    No edema; adequate peripheral perfusion Skin:      Warm and dry; no rash Neuro: alert and oriented x 3 Psych: normal mood and affect  Data Reviewed:  Reviewed labs, radiology imaging, old records and pertinent past GI work up   Assessment and Plan/Recommendations: 71 year old female with history of chronic intermittent dysphagia, status post recent hospitalization in December 2017 with aspiration pneumonitis in the setting of food impaction is here for follow-up visit  Barium esophagogram did not show any significant stricture in the lower esophagus, was unremarkable except for evidence of gastroesophageal reflux and nonspecific peristaltic breaks I personally reviewed the images of barium esophagram and discussed the findings with the patient  Given patient is currently asymptomatic, will hold off repeat EGD with possible dilation Advise patient to cut up food into small pieces smaller than 1/4 inch and to chew well before swallowing Small sips of water with meals Follow antireflux measures Patient is not taking PPI, reluctant because of potential side effects Start H2 blocker, ranitidine 150 mg at bedtime daily  Due for recall colonoscopy in September 2024  Return as needed  25 minutes was spent face-to-face with the patient. Greater than 50% of the time used for counseling as well as  treatment plan and follow-up. She had multiple questions which were answered to her satisfaction  K. Denzil Magnuson , MD [phone] Mon-Fri 8a-5p [phone] after 5p, weekends, holidays  CC: Burns, Erika Lick, MD

## 2016-11-09 NOTE — Patient Instructions (Addendum)
We will send Zantac to your pharmacy today  Follow up as needed  Your recall colonoscopy is 06/2023

## 2017-01-18 ENCOUNTER — Telehealth: Payer: Self-pay | Admitting: Internal Medicine

## 2017-01-18 NOTE — Telephone Encounter (Signed)
Called patient to schedule awv. Lvm for patient to call office to schedule appt.  °

## 2017-01-19 ENCOUNTER — Ambulatory Visit (HOSPITAL_COMMUNITY)
Admission: RE | Admit: 2017-01-19 | Discharge: 2017-01-19 | Disposition: A | Discharge status: Home / Self Care | Payer: Medicare Other | Source: Ambulatory Visit | Attending: Cardiovascular Disease | Admitting: Cardiovascular Disease

## 2017-01-19 DIAGNOSIS — I82402 Acute embolism and thrombosis of unspecified deep veins of left lower extremity: Secondary | ICD-10-CM | POA: Diagnosis not present

## 2017-01-21 ENCOUNTER — Telehealth: Payer: Self-pay | Admitting: Adult Health

## 2017-01-21 NOTE — Telephone Encounter (Signed)
DVT has resolved . Keep ov with Dr. Melvyn Novas Next week and discuss results in detail.  Will decide on length of Xarelto therapy as well.   Spoke with pt and notified of results per TP. Pt verbalized understanding and denied any questions.

## 2017-01-21 NOTE — Progress Notes (Signed)
See 4.5.18 phone note 

## 2017-01-25 ENCOUNTER — Encounter: Payer: Self-pay | Admitting: Internal Medicine

## 2017-01-25 ENCOUNTER — Ambulatory Visit (INDEPENDENT_AMBULATORY_CARE_PROVIDER_SITE_OTHER): Payer: Medicare Other | Admitting: Internal Medicine

## 2017-01-25 VITALS — BP 120/76 | HR 102 | Ht 66.0 in | Wt 158.6 lb

## 2017-01-25 DIAGNOSIS — R058 Other specified cough: Secondary | ICD-10-CM

## 2017-01-25 DIAGNOSIS — R05 Cough: Secondary | ICD-10-CM | POA: Insufficient documentation

## 2017-01-25 DIAGNOSIS — I82402 Acute embolism and thrombosis of unspecified deep veins of left lower extremity: Secondary | ICD-10-CM | POA: Diagnosis not present

## 2017-01-25 NOTE — Assessment & Plan Note (Signed)
See DgEs 10/26/16 c/w GERD  > refused all rx   Of the three most common causes of  Sub-acute or recurrent or chronic cough, only one (GERD)  can actually contribute to/ trigger  the other two (asthma and post nasal drip syndrome)  and perpetuate the cylce of cough.  While not intuitively obvious, many patients with chronic low grade reflux do not cough until there is a primary insult that disturbs the protective epithelial barrier and exposes sensitive nerve endings.   This is typically viral but can be direct physical injury such as with an endotracheal tube.   The point is that once this occurs, it is difficult to eliminate the cycle  using anything but a maximally effective acid suppression regimen at least in the short run, accompanied by an appropriate diet to address non acid GERD and control / eliminate the cough itself for at least 3 days.    Advised re above / again refused meds > contingencies reviewed

## 2017-01-25 NOTE — Patient Instructions (Signed)
GERD (REFLUX)  is an extremely common cause of respiratory symptoms just like yours , many times with no obvious heartburn at all.    It can be treated with medication, but also with lifestyle changes including elevation of the head of your bed (ideally with 6 inch  bed blocks),  Smoking cessation, avoidance of late meals, excessive alcohol, and avoid fatty foods, chocolate, peppermint, colas, red wine, and acidic juices such as orange juice.  NO MINT OR MENTHOL PRODUCTS SO NO COUGH DROPS  USE SUGARLESS CANDY INSTEAD (Jolley ranchers or Stover's or Life Savers) or even ice chips will also do - the key is to swallow to prevent all throat clearing. NO OIL BASED VITAMINS - use powdered substitutes.    In the event of a worsening of the throat clearing or cough for any reason add ranitidine 150 mg or pepcid 20 mg twice daily (after breakfast or bedtime)   In the event of leg swelling for any reason also contact Dr Quay Burow immediately or call this office but further follow up here will be as needed

## 2017-01-25 NOTE — Progress Notes (Signed)
@Patient  ID: Erika Walker, female    DOB: [birth year]    MRN: [id number]     Brief patient profile:  71 yo female never smoker seen for PCCM consult during hospitalization 09/24/16 for aspiration PNA requiring vent support (after choking episode)  Hx of Chest trauma ~1992  s/p right pneumonectomy with prolonged illness/decannulation sequeale with dysphagia/choking.     TESTS Erika Walker  09/2016 endoscopy by Dr. Fuller Plan on December 8 that showed esophageal stenosis, reflux esophagitis and a small hiatal hernia 09/2016 speech pathologist recommended on a D2 diet with thin liquids 09/2016  ENT and extubated in the OR. This did not show any evidence of subglottic stenosis. The larynx appeared normal, but swollen around the level of the cords.  CXR 10/05/16 resolution of Left sided PNA  10/05/16 DVT on acute Left DVT -peroneal vein started on Xarelto   10/23/2016 NP Follow up : PNA/DVT  Pt returns for 2 week follow up . She was seen last visit for post hospital visit for aspiration PNA after choking episode requiring vent support. She was improved with resolution on CXR . Breathing has returned to baseline with no residual cough or dyspnea.  Last ov noted some left leg swelling , no chest pain or dyspnea. Venous doppler revealed an acute left peroneal DVT . She has had no hx of DVT/VTE.  She was started on Xarelto w/ initial starter pack and xarelto 20mg  to followed on day 22.  D/c'd xarelto 01/20/17 p neg venous dopplers    01/25/2017  f/u ov/Erika Walker re: DVT/ UACS - off all meds "I'm not taking any and don't plan to" Chief Complaint  Patient presents with  . Follow-up    Doing well and denies any co's today.   Not limited by breathing from desired activities / no more leg symptoms at all   Feels present throat clearing due to allergies/ reviewed last GI rec with documented GERD rec at least h2 at hs never did this   No obvious day to day or daytime variability or assoc excess/ purulent sputum or  mucus plugs or hemoptysis or cp or chest tightness, subjective wheeze or overt sinus or hb symptoms. No unusual exp hx or h/o childhood pna/ asthma or knowledge of premature birth.  Sleeping ok without nocturnal  or early am exacerbation  of respiratory  c/o's or need for noct saba. Also denies any obvious fluctuation of symptoms with weather or environmental changes or other aggravating or alleviating factors except as outlined above   Current Medications, Allergies, Complete Past Medical History, Past Surgical History, Family History, and Social History were reviewed in [employer] record.  ROS  The following are not active complaints unless bolded sore throat, dysphagia, dental problems, itching, sneezing,  nasal congestion or excess/ purulent secretions, ear ache,   fever, chills, sweats, unintended wt loss, classically pleuritic or exertional cp,  orthopnea pnd or leg swelling, presyncope, palpitations, abdominal pain, anorexia, nausea, vomiting, diarrhea  or change in bowel or bladder habits, change in stools or urine, dysuria,hematuria,  rash, arthralgias, visual complaints, headache, numbness, weakness or ataxia or problems with walking or coordination,  change in mood/affect or memory.               Physical Exam  amb pleasant wf nad/ freq throat clearing   Wt Readings from Last 3 Encounters:  01/25/17 158 lb 9.6 oz (71.9 kg)  11/09/16 155 lb 4 oz (70.4 kg)  10/23/16 155 lb (70.3 kg)  Vital signs reviewed  - - Note on arrival 02 sats  100% on RA   HEENT: nl dentition, turbinates bilaterally, and oropharynx.\which is pristine. Nl external ear canals without cough reflex   NECK :  without JVD/Nodes/TM/ nl carotid upstrokes bilaterally   LUNGS: no acc muscle use,  Nl contour chest with decreased B on R o/w clear    CV:  RRR  no s3 or murmur or increase in P2, and no edema   ABD:  soft and nontender with nl inspiratory excursion in the supine  position. No bruits or organomegaly appreciated, bowel sounds nl  MS:  Nl gait/ ext warm without deformities, calf tenderness, cyanosis or clubbing No obvious joint restrictions   SKIN: warm and dry without lesions    NEURO:  alert, approp, nl sensorium with  no motor or cerebellar deficits apparent.                    Assessment & Plan:

## 2017-01-25 NOTE — Assessment & Plan Note (Signed)
10/05/16 DVT on acute Left DVT -peroneal vein started on Xarelto  Provoked after hospital/critical illness 09/2016 Repeat venous doppler 01/20/17 NEG for DVT > d/c'd xarelto   I had an extended final summary discussion with the patient reviewing all relevant studies completed to date and  lasting 15 to 20 minutes of a 25 minute visit on the following issues:   1)  dvt provoked, symptomatic, resolved > rec at least baby asa daily > declined  2) greatest risk for recurrence is h/o occurrence > usual precautions reviewed  3) f/u if any symptoms per Dr Quay Burow, pulmonary f/u prn

## 2017-02-17 ENCOUNTER — Ambulatory Visit: Payer: Self-pay | Admitting: Internal Medicine

## 2017-03-01 ENCOUNTER — Ambulatory Visit: Payer: Self-pay | Admitting: Internal Medicine

## 2017-03-04 ENCOUNTER — Ambulatory Visit (INDEPENDENT_AMBULATORY_CARE_PROVIDER_SITE_OTHER): Payer: Medicare Other | Admitting: Internal Medicine

## 2017-03-04 ENCOUNTER — Encounter: Payer: Self-pay | Admitting: Internal Medicine

## 2017-03-04 ENCOUNTER — Other Ambulatory Visit (INDEPENDENT_AMBULATORY_CARE_PROVIDER_SITE_OTHER): Payer: Medicare Other

## 2017-03-04 VITALS — BP 134/82 | HR 90 | Temp 98.6°F | Resp 16 | Ht 66.0 in | Wt 152.0 lb

## 2017-03-04 DIAGNOSIS — Z902 Acquired absence of lung [part of]: Secondary | ICD-10-CM

## 2017-03-04 DIAGNOSIS — Z13820 Encounter for screening for osteoporosis: Secondary | ICD-10-CM | POA: Diagnosis not present

## 2017-03-04 DIAGNOSIS — Z9889 Other specified postprocedural states: Secondary | ICD-10-CM

## 2017-03-04 DIAGNOSIS — R739 Hyperglycemia, unspecified: Secondary | ICD-10-CM | POA: Diagnosis not present

## 2017-03-04 DIAGNOSIS — E2839 Other primary ovarian failure: Secondary | ICD-10-CM

## 2017-03-04 DIAGNOSIS — E7800 Pure hypercholesterolemia, unspecified: Secondary | ICD-10-CM

## 2017-03-04 DIAGNOSIS — R1319 Other dysphagia: Secondary | ICD-10-CM

## 2017-03-04 DIAGNOSIS — E785 Hyperlipidemia, unspecified: Secondary | ICD-10-CM | POA: Insufficient documentation

## 2017-03-04 DIAGNOSIS — Z0000 Encounter for general adult medical examination without abnormal findings: Secondary | ICD-10-CM | POA: Diagnosis not present

## 2017-03-04 DIAGNOSIS — R1310 Dysphagia, unspecified: Secondary | ICD-10-CM

## 2017-03-04 DIAGNOSIS — R7303 Prediabetes: Secondary | ICD-10-CM | POA: Insufficient documentation

## 2017-03-04 LAB — COMPREHENSIVE METABOLIC PANEL
ALT: 9 U/L (ref 0–35)
AST: 14 U/L (ref 0–37)
Albumin: 4.2 g/dL (ref 3.5–5.2)
Alkaline Phosphatase: 78 U/L (ref 39–117)
BUN: 15 mg/dL (ref 6–23)
CALCIUM: 9.5 mg/dL (ref 8.4–10.5)
CHLORIDE: 105 meq/L (ref 96–112)
CO2: 28 meq/L (ref 19–32)
Creatinine, Ser: 1.1 mg/dL (ref 0.40–1.20)
GFR: 52.13 mL/min — AB (ref >60.00)
Glucose, Bld: 108 mg/dL — ABNORMAL HIGH (ref 70–99)
POTASSIUM: 4.9 meq/L (ref 3.5–5.1)
Sodium: 138 mEq/L (ref 135–145)
Total Bilirubin: 0.4 mg/dL (ref 0.2–1.2)
Total Protein: 7.4 g/dL (ref 6.0–8.3)

## 2017-03-04 LAB — CBC WITH DIFFERENTIAL/PLATELET
BASOS ABS: 0.1 10*3/uL (ref 0.0–0.1)
Basophils Relative: 1 % (ref 0.0–3.0)
EOS ABS: 0.1 10*3/uL (ref 0.0–0.7)
Eosinophils Relative: 1.4 % (ref 0.0–5.0)
HCT: 45.1 % (ref 36.0–46.0)
Hemoglobin: 15.4 g/dL — ABNORMAL HIGH (ref 12.0–15.0)
LYMPHS ABS: 1.9 10*3/uL (ref 0.7–4.0)
Lymphocytes Relative: 24.6 % (ref 12.0–46.0)
MCHC: 34.2 g/dL (ref 30.0–36.0)
MCV: 91.1 fl (ref 78.0–100.0)
Monocytes Absolute: 0.5 10*3/uL (ref 0.1–1.0)
Monocytes Relative: 6.6 % (ref 3.0–12.0)
NEUTROS ABS: 5.1 10*3/uL (ref 1.4–7.7)
Neutrophils Relative %: 66.4 % (ref 43.0–77.0)
PLATELETS: 253 10*3/uL (ref 150.0–400.0)
RBC: 4.95 Mil/uL (ref 3.87–5.11)
RDW: 12.9 % (ref 11.5–15.5)
WBC: 7.6 10*3/uL (ref 4.0–10.5)

## 2017-03-04 LAB — LIPID PANEL
CHOL/HDL RATIO: 8
CHOLESTEROL: 307 mg/dL — AB (ref 0–200)
HDL: 40.1 mg/dL (ref >39.00)
NonHDL: 267.32
TRIGLYCERIDES: 303 mg/dL — AB (ref 0.0–149.0)
VLDL: 60.6 mg/dL — AB (ref 0.0–40.0)

## 2017-03-04 LAB — HEMOGLOBIN A1C: Hgb A1c MFr Bld: 5.8 % (ref 4.6–6.5)

## 2017-03-04 LAB — LDL CHOLESTEROL, DIRECT: LDL DIRECT: 187 mg/dL

## 2017-03-04 LAB — TSH: TSH: 4.15 u[IU]/mL (ref 0.35–4.50)

## 2017-03-04 NOTE — Assessment & Plan Note (Signed)
Check cmp, a1c

## 2017-03-04 NOTE — Assessment & Plan Note (Signed)
No current dysphasia Chewing food thoroughly

## 2017-03-04 NOTE — Patient Instructions (Addendum)
  Ms. Fuster , Thank you for taking time to come for your Medicare Wellness Visit. I appreciate your ongoing commitment to your health goals. Please review the following plan we discussed and let me know if I can assist you in the future.   These are the goals we discussed: Goals    Increase exercise      This is a list of the screening recommended for you and due dates:  Health Maintenance  Topic Date Due  . Flu Shot  05/19/2017  . Mammogram  12/12/2017  . Tetanus Vaccine  03/18/2021  . DEXA scan (bone density measurement)  Completed  .  Hepatitis C: One time screening is recommended by Center for Disease Control  (CDC) for  adults born from [birth year] through 1965.   Completed  . Pneumonia vaccines  Completed     Test(s) ordered today. Your results will be released to [place] (or called to you) after review, usually within 72hours after test completion. If any changes need to be made, you will be notified at that same time.  All other Health Maintenance issues reviewed.   All recommended immunizations and age-appropriate screenings are up-to-date or discussed.  No immunizations administered today. Consider the shingles vaccine - you will need to get this at a local pharmacy.   Medications reviewed and updated.   No changes recommended at this time.    Please followup in one year for a wellness exam.

## 2017-03-04 NOTE — Progress Notes (Signed)
Subjective:    Patient ID: Erika Walker, female    DOB: 11-12-1945, 71 y.o.   MRN: [id number]  HPI Here for medicare wellness exam and follow up of chronic medical problems.   I have personally reviewed and have noted 1.The patient's medical and social history 2.Their use of alcohol, tobacco or illicit drugs 3.Their current medications and supplements 4.The patient's functional ability including ADL's, fall risks, home safety risks and hearing or visual impairment. 5.Diet and physical activities 6.Evidence for depression or mood disorders 7.Care team reviewed  - derm - Dr Johny Shock, eye doctor   Are there smokers in your home (other than you)? No  Risk Factors Exercise: yoga once a week, stretching  Dietary issues discussed: trying to cut down on portions, well balanced, cooks, working with nutritionist to make improvements  Cardiac risk factors: advanced age, hyperlipidemia  Depression Screen  Have you felt down, depressed or hopeless? No  Have you felt little interest or pleasure in doing things?  No  Activities of Daily Living In your present state of health, do you have any difficulty performing the following activities?:  Driving? No Managing money?  No Feeding yourself? No Getting from bed to chair? No Climbing a flight of stairs? No Preparing food and eating?: No Bathing or showering? No Getting dressed: No Getting to/using the toilet? No Moving around from place to place: No In the past year have you fallen or had a near fall?: No   Are you sexually active?  No  Do you have more than one partner?  N/A  Hearing Difficulties:   Do you often ask people to speak up or repeat themselves? No Do you experience ringing or noises in your ears? No Do you have difficulty understanding soft or whispered voices? No Vision:              Any change in vision:  no             Up to date with eye  exam:  Yes  Memory:  Do you feel that you have a problem with memory? No  Do you often misplace items? No  Do you feel safe at home?  Yes  Cognitive Testing  Alert, Orientated? Yes  Normal Appearance? Yes  Recall of three objects?  Yes  Can perform simple calculations? Yes  Displays appropriate judgment? Yes  Can read the correct time from a watch face? Yes   Advanced Directives have been discussed with the patient? Yes - in place   Medications and allergies reviewed with patient and updated if appropriate.  Patient Active Problem List   Diagnosis Date Noted  . Hyperglycemia 03/04/2017  . Upper airway cough syndrome 01/25/2017  . DVT (deep venous thrombosis) ([place]) 10/23/2016  . Aspiration pneumonia ([place]) 10/05/2016  . Leg swelling 10/05/2016  . Esophageal stricture   . Gastroesophageal reflux disease with esophagitis   . Stridor   . Paroxysmal dyspnea 08/20/2013  . Esophageal dysphagia 07/19/2013  . H/O pneumonectomy 03/31/2010    Current Outpatient Prescriptions on File Prior to Visit  Medication Sig Dispense Refill  . EPINEPHrine (EPIPEN) 0.3 mg/0.3 mL DEVI Inject 0.3 mLs (0.3 mg total) into the muscle as needed. (Patient not taking: Reported on 03/04/2017) 2 Device 1   No current facility-administered medications on file prior to visit.     Past Medical History:  Diagnosis Date  . HLD (hyperlipidemia)     Past Surgical History:  Procedure Laterality Date  . COLONOSCOPY  2014   Dr Olevia Perches  . ESOPHAGOGASTRODUODENOSCOPY N/A 09/25/2016   Procedure: ESOPHAGOGASTRODUODENOSCOPY (EGD);  Surgeon: Ladene Artist, MD;  Location: [facility] ENDOSCOPY;  Service: Endoscopy;  Laterality: N/A;  at bedside  . LARYNGOSCOPY AND BRONCHOSCOPY N/A 09/26/2016   Procedure: MICRO LARYNGOSCOPY AND  EXTUBATION;  Surgeon: Izora Gala, MD;  Location: [place];  Service: ENT;  Laterality: N/A;  . lung removed Right 1992   post trauma    Social History   Social History  . Marital status: Married     Spouse name: N/A  . Number of children: 3  . Years of education: N/A   Occupational History  . realtor [employer]   Social History Main Topics  . Smoking status: Never Smoker  . Smokeless tobacco: Never Used  . Alcohol use No  . Drug use: No  . Sexual activity: Yes    Birth control/ protection: Post-menopausal   Other Topics Concern  . None   Social History Narrative  . None    Family History  Problem Relation Age of Onset  . Liver cancer Brother   . Diabetes Brother        pre-diabetic  . Colon cancer Neg Hx   . Pancreatic cancer Neg Hx   . Stomach cancer Neg Hx   . Stroke Neg Hx   . Heart disease Neg Hx   . Esophageal cancer Neg Hx     Review of Systems  Constitutional: Negative for appetite change, chills, fatigue, fever and unexpected weight change.  HENT: Positive for postnasal drip. Negative for hearing loss, sore throat, tinnitus and trouble swallowing.   Eyes: Negative for visual disturbance.  Respiratory: Positive for shortness of breath (with exertion - chronic). Negative for cough and wheezing.   Cardiovascular: Negative for chest pain, palpitations and leg swelling.  Gastrointestinal: Negative for abdominal pain, blood in stool, constipation, diarrhea and nausea.       Denies GERD  Genitourinary: Negative for dysuria and hematuria.  Musculoskeletal: Negative for arthralgias, back pain and myalgias.  Skin: Negative for color change and rash.  Neurological: Negative for dizziness, light-headedness and headaches.  Psychiatric/Behavioral: Negative for dysphoric mood. The patient is not nervous/anxious.        Objective:   Vitals:   03/04/17 0856  BP: 134/82  Pulse: 90  Resp: 16  Temp: 98.6 F (37 C)   Filed Weights   03/04/17 0856  Weight: 152 lb (68.9 kg)   Body mass index is 24.53 kg/m.  Wt Readings from Last 3 Encounters:  03/04/17 152 lb (68.9 kg)  01/25/17 158 lb 9.6 oz (71.9 kg)  11/09/16 155 lb 4 oz (70.4 kg)      Physical Exam Constitutional: She appears well-developed and well-nourished. No distress.  HENT:  Head: Normocephalic and atraumatic.  Right Ear: External ear normal. Normal ear canal and TM Left Ear: External ear normal.  Normal ear canal and TM Mouth/Throat: Oropharynx is clear and moist.  Eyes: Conjunctivae and EOM are normal.  Neck: Neck supple. No tracheal deviation present. No thyromegaly present.  No carotid bruit  Cardiovascular: Normal rate, regular rhythm and normal heart sounds.   No murmur heard.  No edema. Pulmonary/Chest: Effort normal and breath sounds normal. No respiratory distress. She has no wheezes. She has no rales.  Breast: deferred  Abdominal: Soft. She exhibits no distension. There is no tenderness.  Lymphadenopathy: She has no cervical adenopathy.  Skin: Skin is warm and dry. She is not diaphoretic.  Psychiatric: She has  a normal mood and affect. Her behavior is normal.         Assessment & Plan:   Wellness Exam: Immunizations  zostavax given 2012 - discussed shingrix, others up to date Colonoscopy    Up to date  Mammogram   Up to date  Dexa - last done 2009 - ordered Gyn - last saw two years ago Eye exam  Up to date  Hearing loss  none Memory concerns/difficulties  none Independent of ADLs   fully Derm - annually - dr Johny Shock Stressed the importance of regular exercise -  Encouraged increasing exercise   Patient received copy of preventative screening tests/immunizations recommended for the next 5-10 years.  See Problem List for Assessment and Plan of chronic medical problems.   Fu annually

## 2017-03-04 NOTE — Assessment & Plan Note (Signed)
Has been elevated in the past - improved with diet Does not want to take a statin Working with a nutritionist Advised increasing exercise

## 2017-03-04 NOTE — Assessment & Plan Note (Signed)
Gets SOB at times with exertion - chronic, stable

## 2017-03-10 ENCOUNTER — Encounter: Payer: Self-pay | Admitting: Emergency Medicine

## 2017-03-23 ENCOUNTER — Other Ambulatory Visit: Payer: Self-pay

## 2017-03-24 ENCOUNTER — Other Ambulatory Visit (HOSPITAL_COMMUNITY): Payer: Self-pay

## 2017-03-24 ENCOUNTER — Ambulatory Visit (INDEPENDENT_AMBULATORY_CARE_PROVIDER_SITE_OTHER)
Admission: RE | Admit: 2017-03-24 | Discharge: 2017-03-24 | Disposition: A | Discharge status: Home / Self Care | Payer: Medicare Other | Source: Ambulatory Visit

## 2017-03-24 ENCOUNTER — Other Ambulatory Visit (HOSPITAL_BASED_OUTPATIENT_CLINIC_OR_DEPARTMENT_OTHER): Payer: Self-pay

## 2017-03-24 DIAGNOSIS — E2839 Other primary ovarian failure: Secondary | ICD-10-CM

## 2017-03-24 DIAGNOSIS — Z13820 Encounter for screening for osteoporosis: Secondary | ICD-10-CM

## 2017-04-29 DIAGNOSIS — T1511XA Foreign body in conjunctival sac, right eye, initial encounter: Secondary | ICD-10-CM | POA: Diagnosis not present

## 2017-04-29 DIAGNOSIS — H0012 Chalazion right lower eyelid: Secondary | ICD-10-CM | POA: Diagnosis not present

## 2017-06-10 DIAGNOSIS — H5203 Hypermetropia, bilateral: Secondary | ICD-10-CM | POA: Diagnosis not present

## 2017-06-10 DIAGNOSIS — H524 Presbyopia: Secondary | ICD-10-CM | POA: Diagnosis not present

## 2017-06-10 DIAGNOSIS — H01001 Unspecified blepharitis right upper eyelid: Secondary | ICD-10-CM | POA: Diagnosis not present

## 2017-06-10 DIAGNOSIS — H43813 Vitreous degeneration, bilateral: Secondary | ICD-10-CM | POA: Diagnosis not present

## 2017-07-20 ENCOUNTER — Ambulatory Visit (INDEPENDENT_AMBULATORY_CARE_PROVIDER_SITE_OTHER): Payer: Medicare Other

## 2017-07-20 DIAGNOSIS — Z23 Encounter for immunization: Secondary | ICD-10-CM | POA: Diagnosis not present

## 2017-07-29 DIAGNOSIS — L814 Other melanin hyperpigmentation: Secondary | ICD-10-CM | POA: Diagnosis not present

## 2017-07-29 DIAGNOSIS — D225 Melanocytic nevi of trunk: Secondary | ICD-10-CM | POA: Diagnosis not present

## 2017-07-29 DIAGNOSIS — D2272 Melanocytic nevi of left lower limb, including hip: Secondary | ICD-10-CM | POA: Diagnosis not present

## 2017-07-29 DIAGNOSIS — L820 Inflamed seborrheic keratosis: Secondary | ICD-10-CM | POA: Diagnosis not present

## 2017-07-29 DIAGNOSIS — D2271 Melanocytic nevi of right lower limb, including hip: Secondary | ICD-10-CM | POA: Diagnosis not present

## 2017-07-29 DIAGNOSIS — Z85828 Personal history of other malignant neoplasm of skin: Secondary | ICD-10-CM | POA: Diagnosis not present

## 2017-07-29 DIAGNOSIS — D1801 Hemangioma of skin and subcutaneous tissue: Secondary | ICD-10-CM | POA: Diagnosis not present

## 2017-07-29 DIAGNOSIS — L821 Other seborrheic keratosis: Secondary | ICD-10-CM | POA: Diagnosis not present

## 2017-07-29 DIAGNOSIS — L570 Actinic keratosis: Secondary | ICD-10-CM | POA: Diagnosis not present

## 2017-10-24 IMAGING — CT CT CHEST W/O CM
2 of 3 series · 15 of 36 positions shown, 18 images · non-contrast
Comparison: Plain films of earlier today.  No prior CT.

CLINICAL DATA: Difficulty breathing. Question subglottic stenosis.
Prior pneumonectomy. Possible choking.

EXAM:
CT CHEST WITHOUT CONTRAST
TECHNIQUE: Multidetector CT imaging of the chest was performed following the
standard protocol without IV contrast.

[Series 3: chest w/o 2mm st · axial · non-contrast · 0.65mm/px · z∈[-516,-262]mm · 12 of 151 slices shown, 15 images]
[im 12/151  mediastinal]
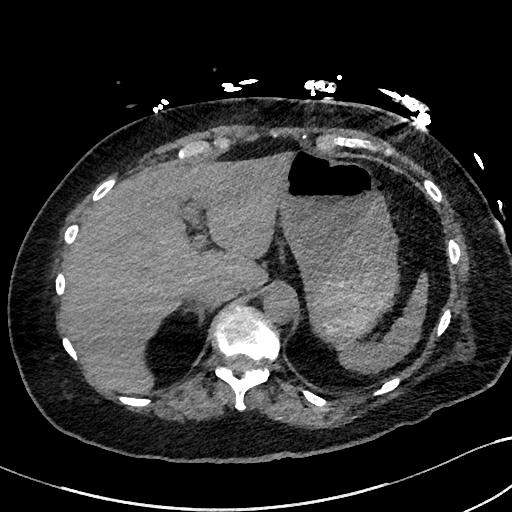
[im 12/151  lung]
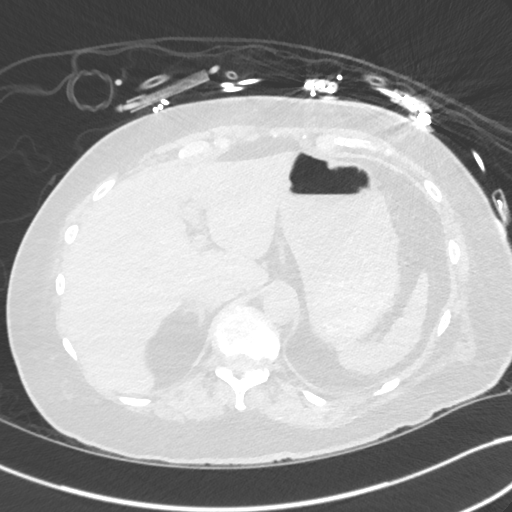
[im 23/151  lung]
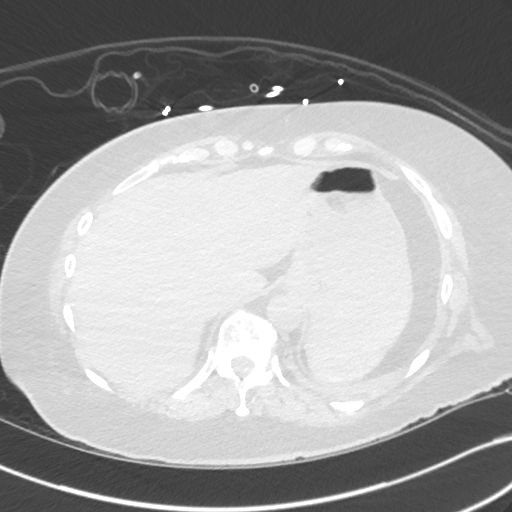
[im 34/151  lung]
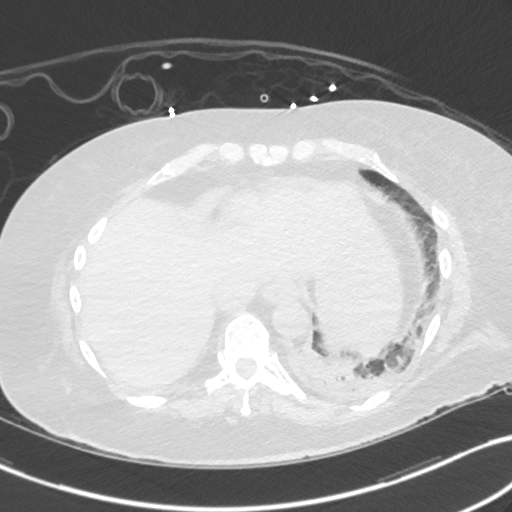
[im 45/151  lung]
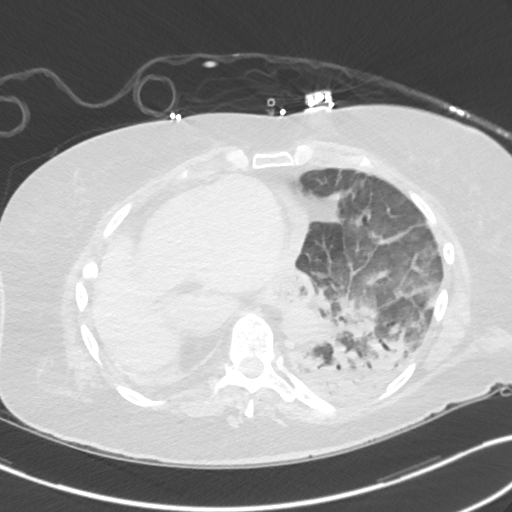
[im 56/151  mediastinal]
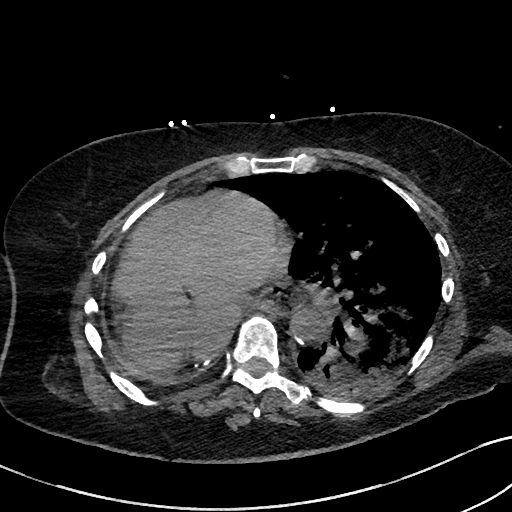
[im 56/151  lung]
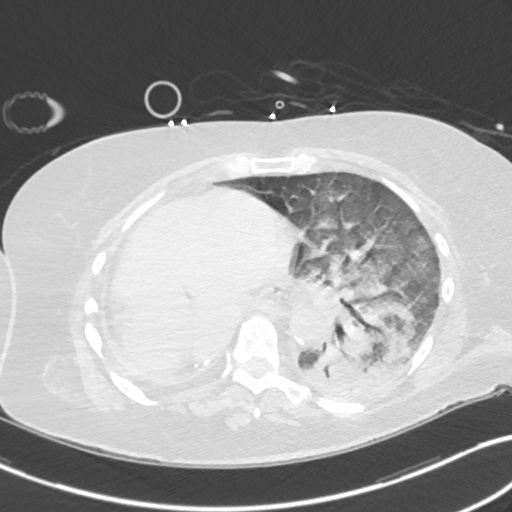
[im 67/151  lung]
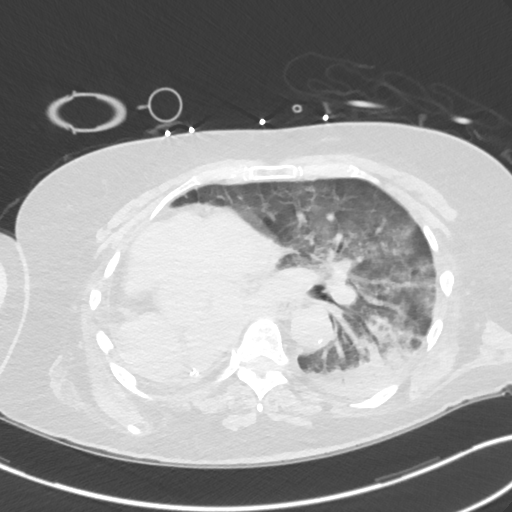
[im 84/151  lung]
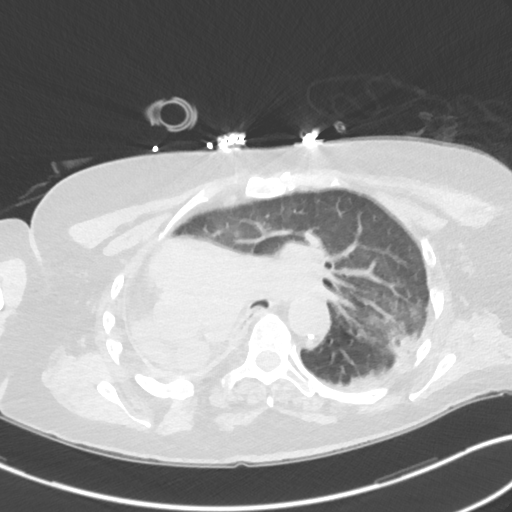
[im 95/151  lung]
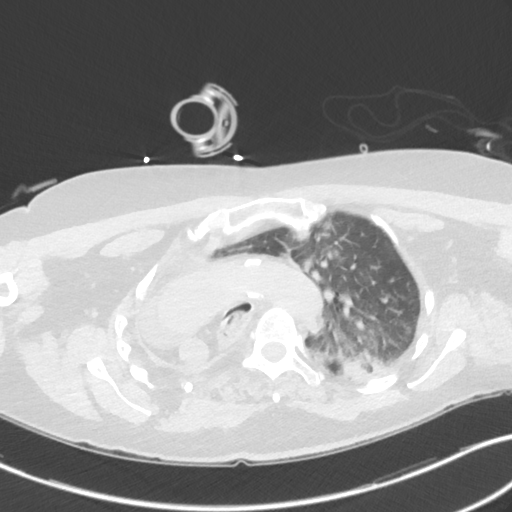
[im 106/151  mediastinal]
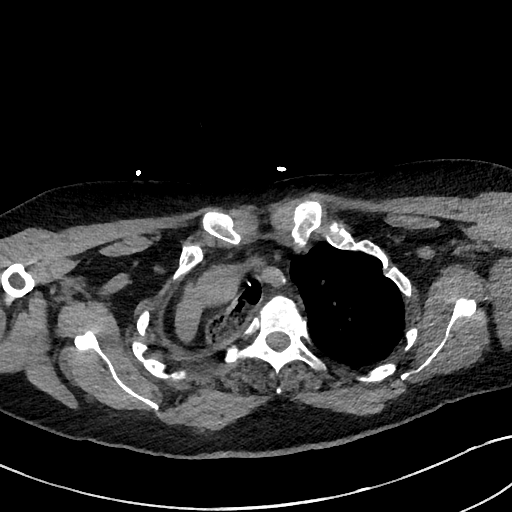
[im 106/151  lung]
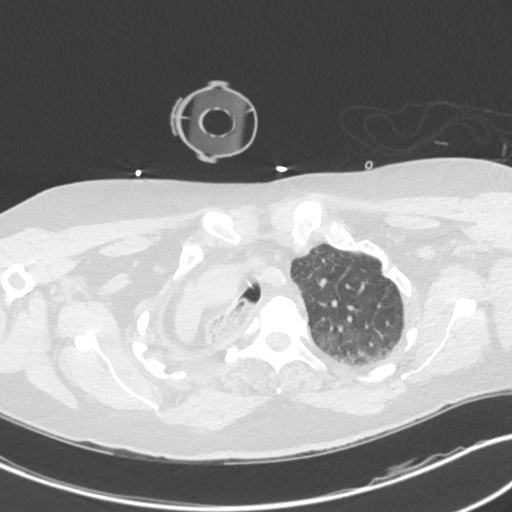
[im 117/151  lung]
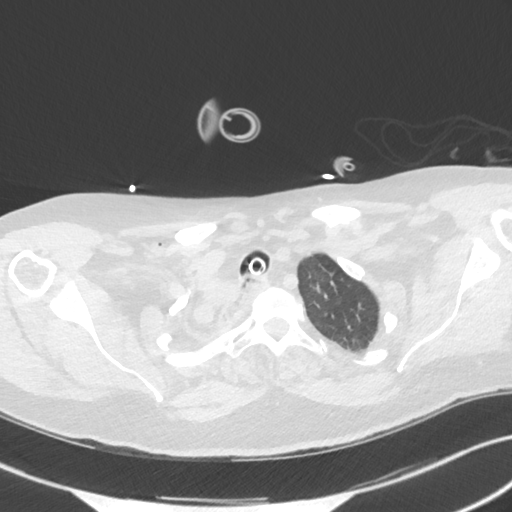
[im 128/151  lung]
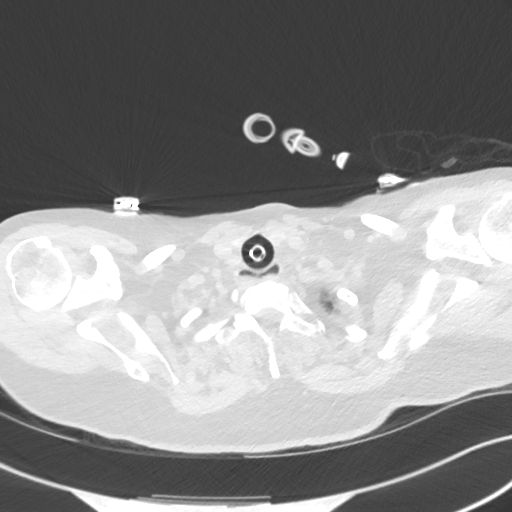
[im 139/151  lung]
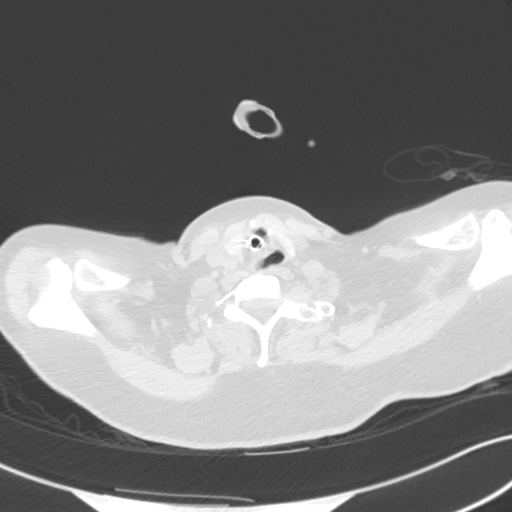

[Series 5: chest w/o 3mm st cor · coronal · non-contrast · 0.59mm/px · 3 of 101 slices shown]
[im 21/101  lung]
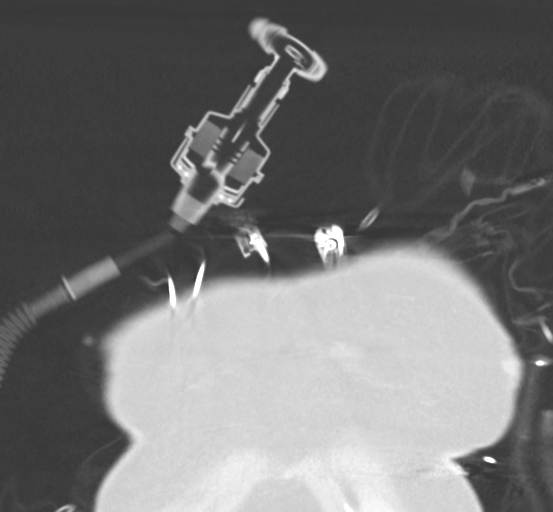
[im 41/101  lung]
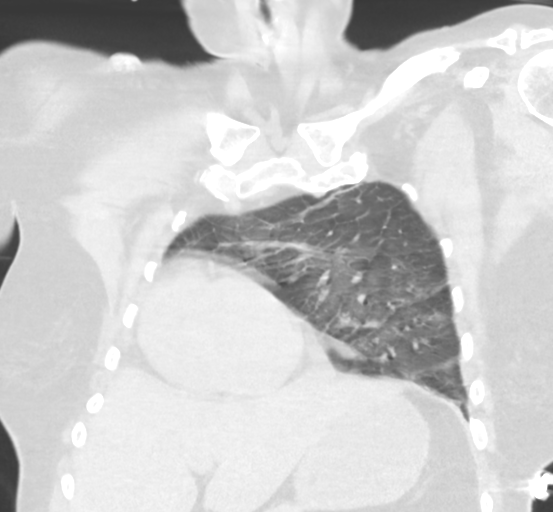
[im 61/101  lung]
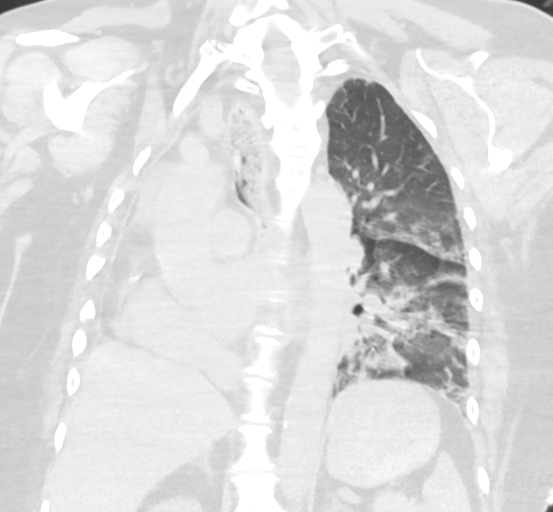

[15 of 36 positions shown; findings below may reference images not displayed]

FINDINGS: Cardiovascular: Aortic and branch vessel atherosclerosis.
Mediastinal shift to the right. Cardiomegaly. No pericardial
effusion. probable left main coronary artery atherosclerosis. Right
coronary artery atherosclerosis.

Mediastinum/Nodes: No supraclavicular adenopathy. limited evaluation
for thoracic adenopathy secondary to anatomic distortion and lack of
IV contrast. No gross adenopathy identified. The esophagus is
dilated and debris filled throughout. This extends to the level of
the thoracic inlet, including on image 33/series 3.

Lungs/Pleura: No pleural fluid. Status post right-sided
pneumonectomy, without air in the pneumonectomy bed.

Degradation secondary to patient arm position and mild motion. No
other radiopaque foreign body identified within the trachea.

Hyperexpansion of the left lung. Dependent lower lobe predominant
patchy airspace and ground-glass opacity with septal thickening.

Upper Abdomen: Degradation continuing into the upper abdomen,
including secondary to EKG lead artifact. Grossly normal noncontrast
appearance of the liver, spleen, stomach, adrenal glands, kidneys.

Musculoskeletal: Presumably posttraumatic defects of right-sided
ribs.
IMPRESSION: 1. Status post right pneumonectomy.
2. Basilar and dependent predominant airspace and ground-glass
opacity throughout the left lung. Favor aspiration. Infection could
look similar.
3. Dilated esophagus with debris throughout. This suggests severe
dysmotility and/or gastroesophageal reflux disease. Patient is
predisposed all high at to aspiration.
4. Cardiomegaly. Coronary artery atherosclerosis. Aortic
atherosclerosis.
5. Moderate degradation, as above

## 2017-10-24 IMAGING — DX DG CHEST 1V PORT
1 series · 1 of 1 positions shown · non-contrast
Comparison: 09/24/2016

CLINICAL DATA: Post intubation

EXAM:
PORTABLE CHEST 1 VIEW

[chest ap]
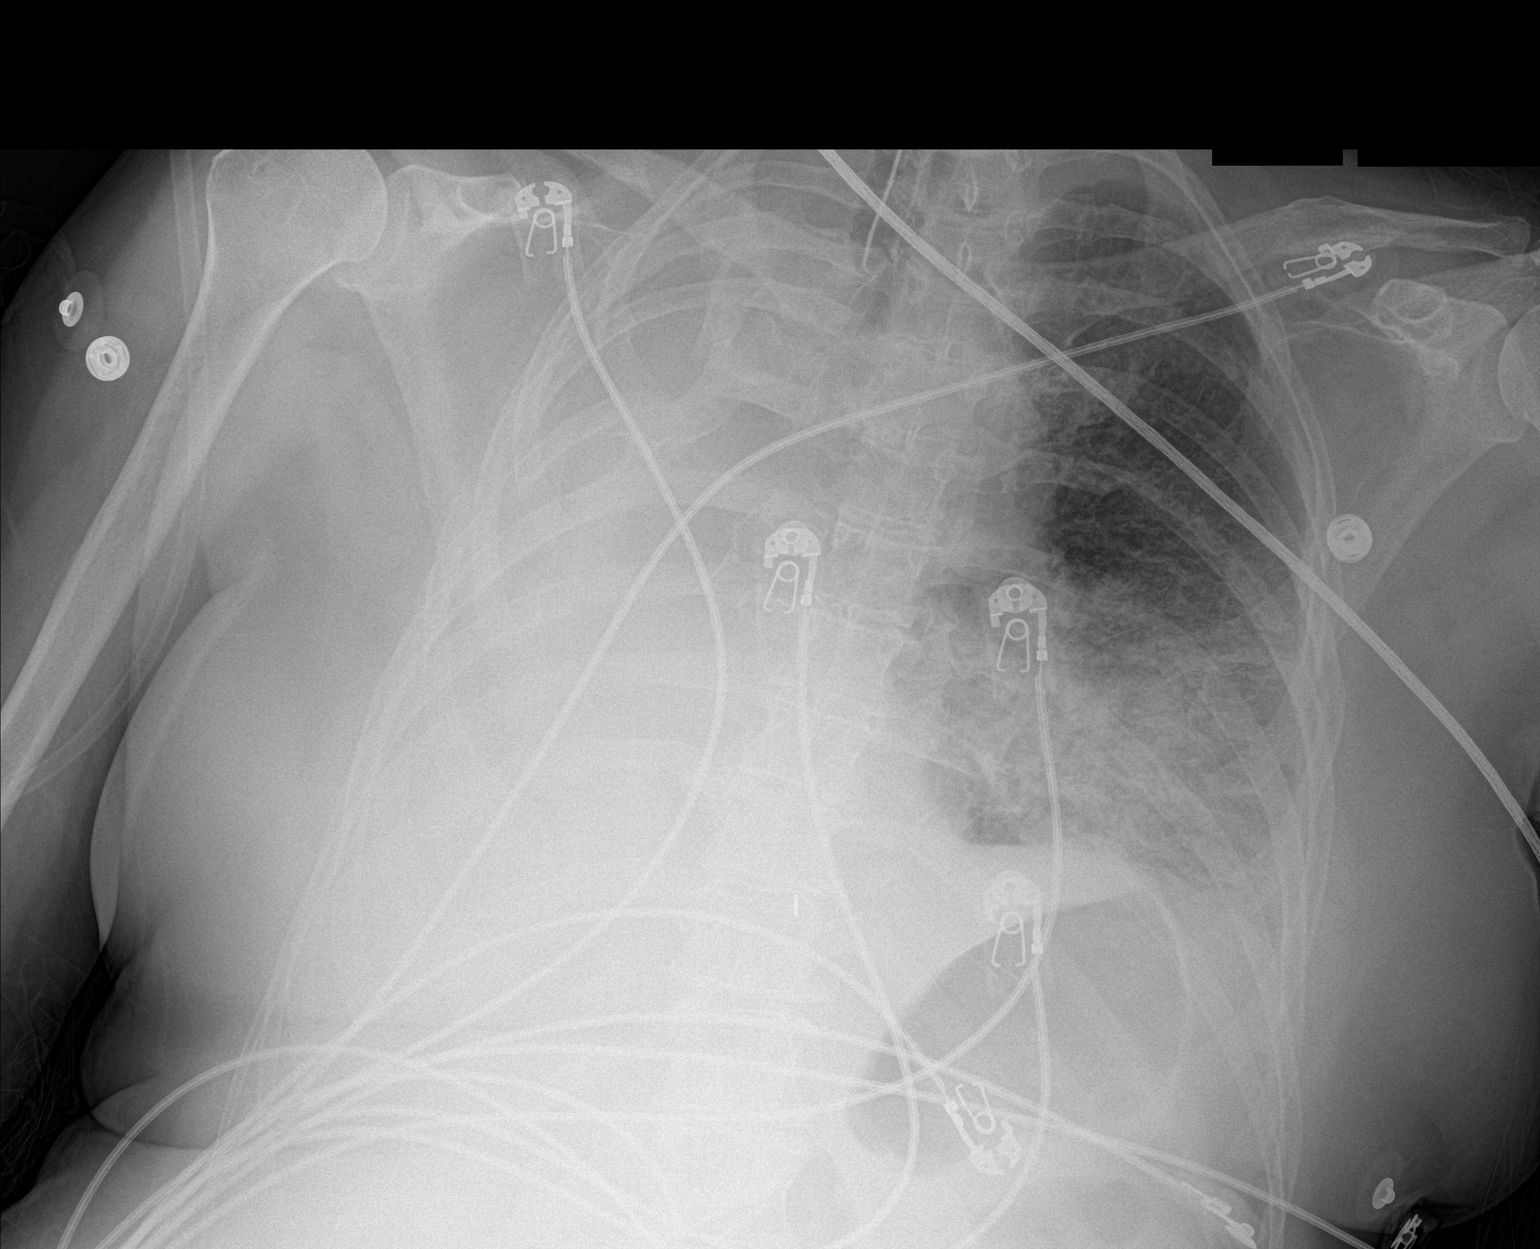

[1 of 1 positions shown; findings below may reference images not displayed]

FINDINGS: Endotracheal tube is 6 cm above the carina. Complete opacification
of the right hemithorax from prior pneumonectomy. Patchy airspace
disease in the left lower lobe. Cannot exclude pneumonia or
aspiration. Heart is likely normal size.
IMPRESSION: New patchy airspace disease in the left lower lobe concerning for
pneumonia. Cannot exclude aspiration.

Prior right pneumonectomy

## 2017-10-24 IMAGING — CT CT NECK W/O CM
4 of 5 series · 15 of 33 positions shown, 17 images · IV contrast (Omni 300)
Comparison: CT chest today.  Chest x-ray today.

CLINICAL DATA: Difficulty breathing. Question subglottic stenosis.
Prior pneumonectomy. Patient choked on chicken bone.

EXAM:
CT NECK WITHOUT CONTRAST
TECHNIQUE: Multidetector CT imaging of the neck was performed following the
standard protocol without intravenous contrast.

[Series 3: neck 2.0 st · axial · 0.46mm/px · z∈[-306,-182]mm · 3 of 126 slices shown (1 of 3)]
[im 32/126  bone]
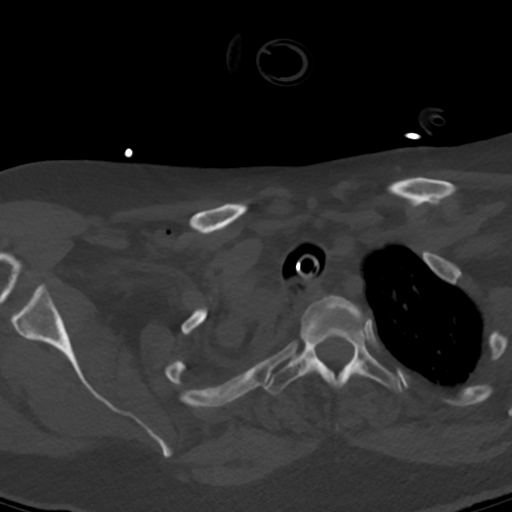
[im 63/126  bone]
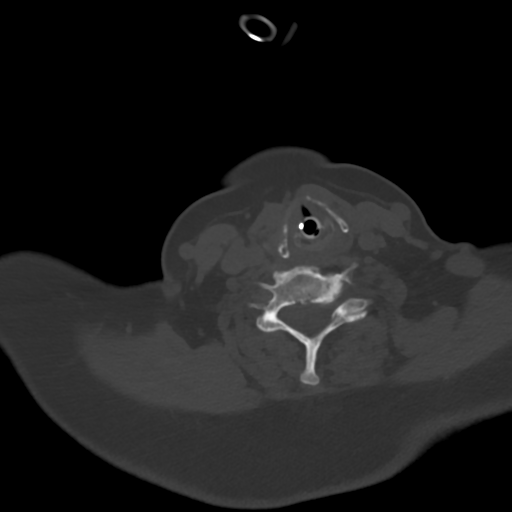
[im 94/126  bone]
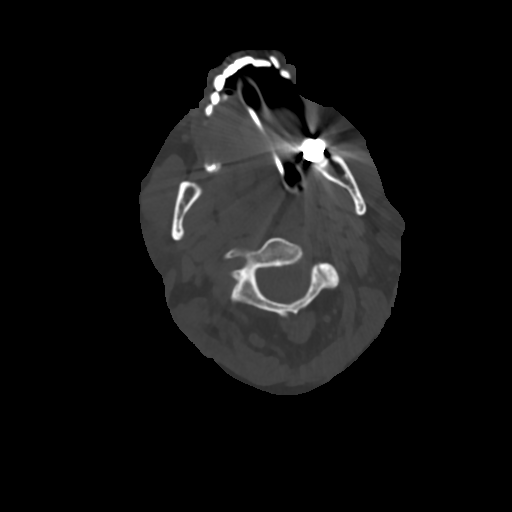

[Series 5: neck 2.0 st · sagittal · 0.50mm/px · 5 of 98 slices shown, 6 images (2 of 3)]
[im 33/98  bone]
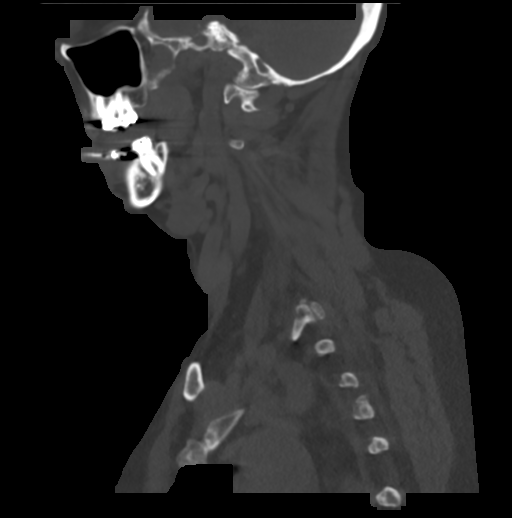
[im 41/98  bone]
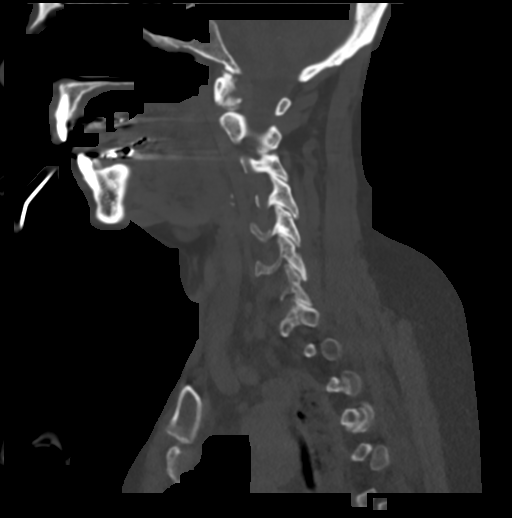
[im 49/98  soft-tissue]
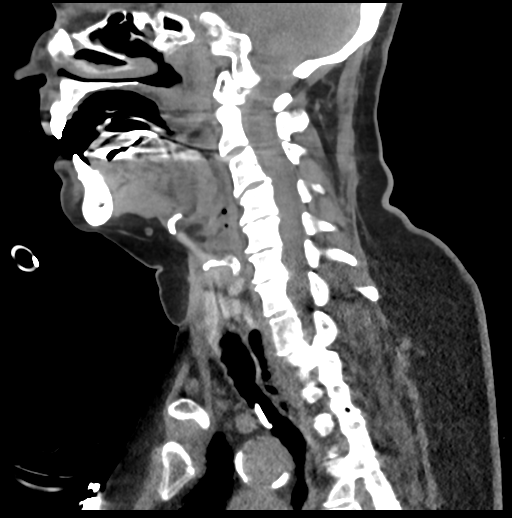
[im 49/98  bone]
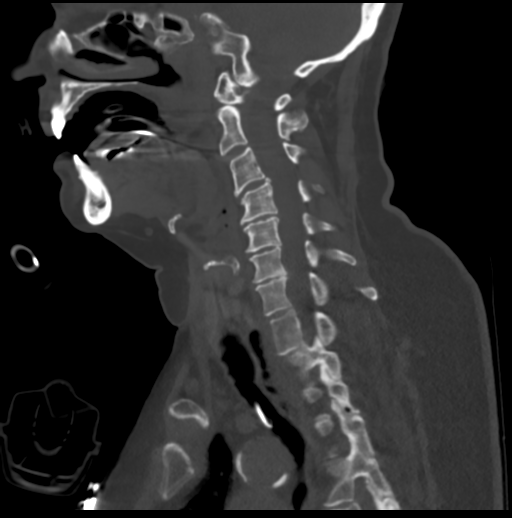
[im 57/98  bone]
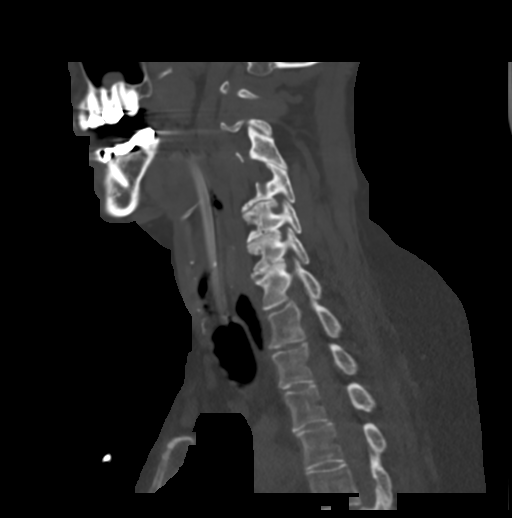
[im 65/98  bone]
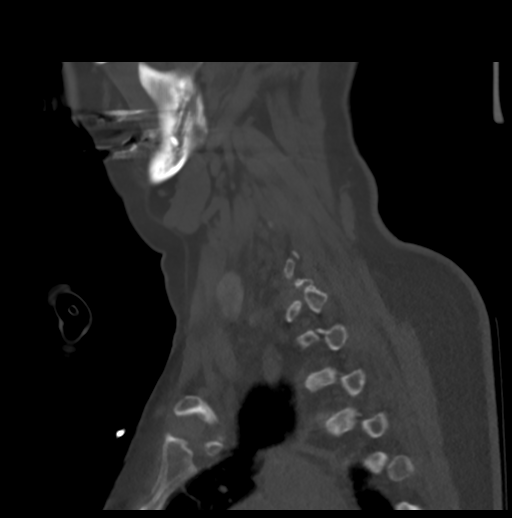

[Series 6: neck 2.0 st · coronal · 0.50mm/px · 3 of 101 slices shown (3 of 3)]
[im 21/101  bone]
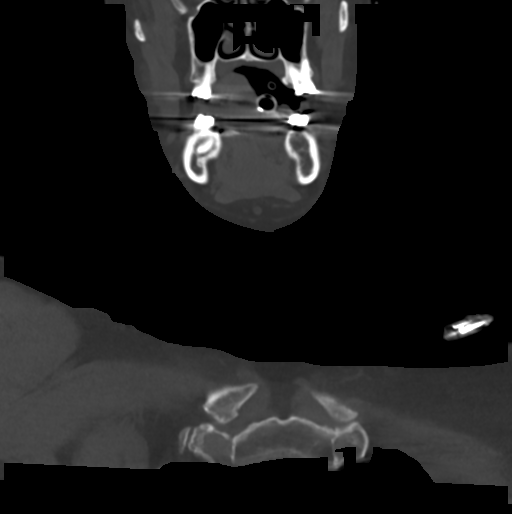
[im 41/101  bone]
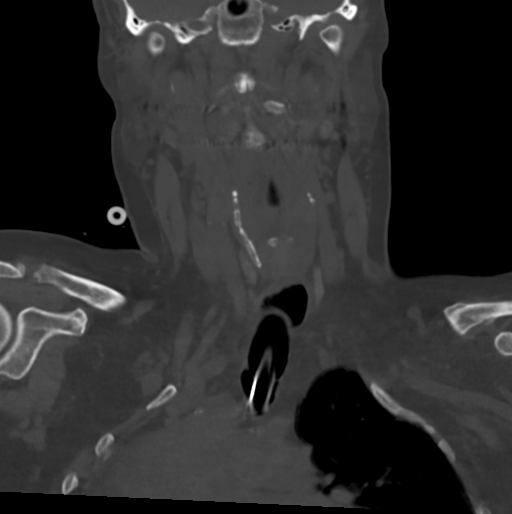
[im 61/101  bone]
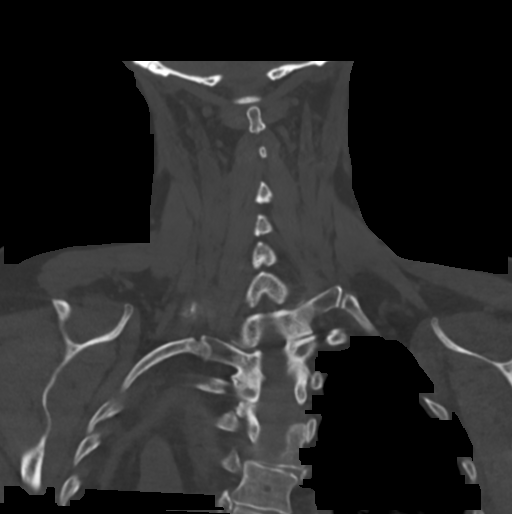

[Series 7: neck 2.0 st orthogonal · axial · 0.39mm/px · z∈[-332,-184]mm · 4 of 127 slices shown, 5 images]
[im 26/127  soft-tissue]
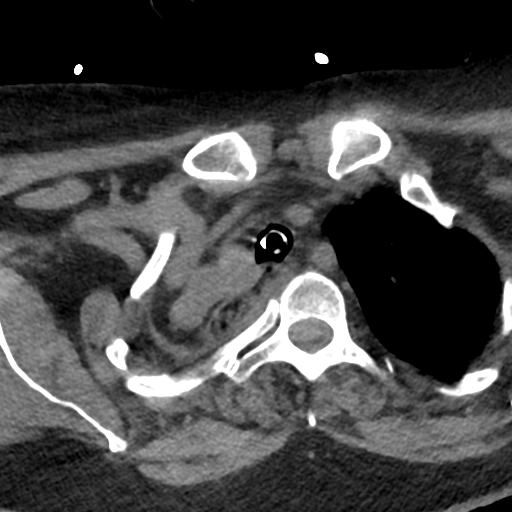
[im 26/127  bone]
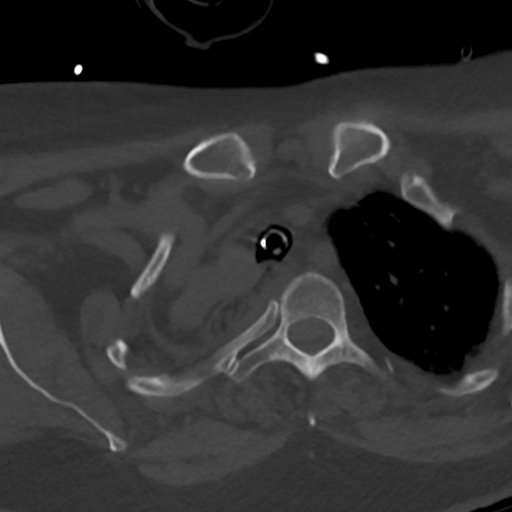
[im 51/127  bone]
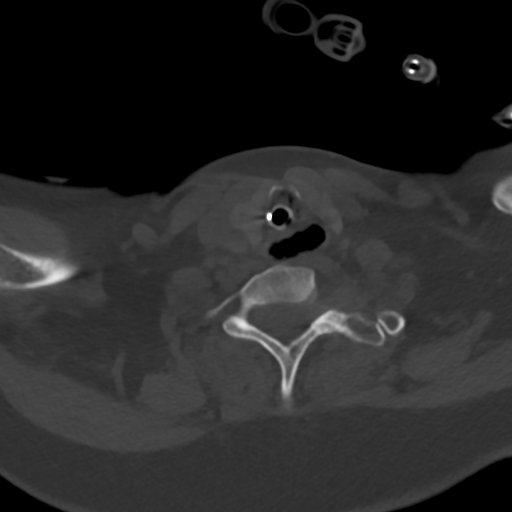
[im 76/127  bone]
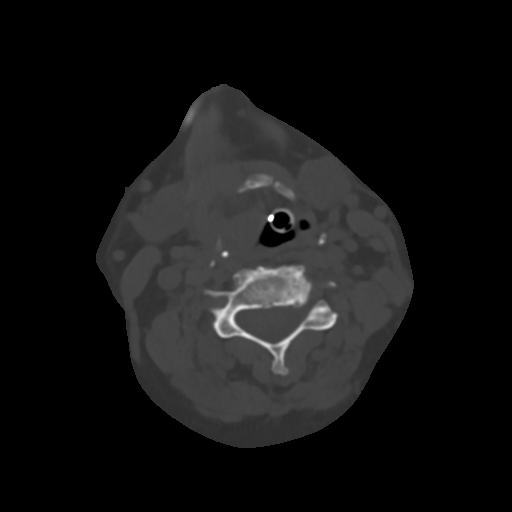
[im 101/127  bone]
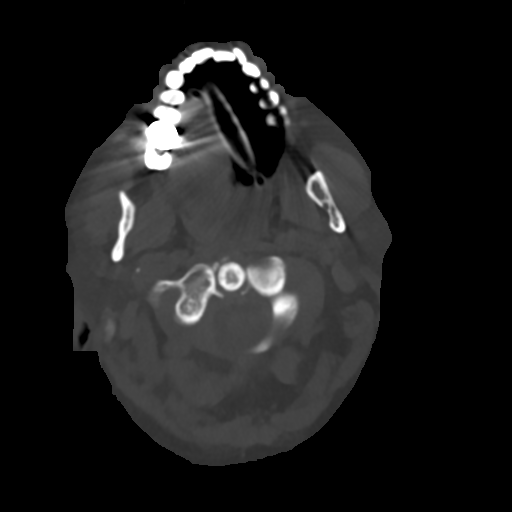

[15 of 33 positions shown; findings below may reference images not displayed]

FINDINGS: Pharynx and larynx: Endotracheal tube passes lateral to the tongue
on the left displacing the tongue to the right. No tongue mass is
identified. Endotracheal tube is in good position in the mid
trachea. The balloon is inflated. No subglottic stenosis identified.
No retained foreign body air chicken bone is present.

The esophagus is dilated and filled with food and air-fluid level.
The esophagus is present to the right of midline related to prior
pneumonectomy.

Salivary glands: Negative

Thyroid: Negative

Lymph nodes: No enlarged or pathologic lymph nodes.

Vascular: Vascular patency not evaluated without intravenous
contrast.

Limited intracranial: Negative

Visualized orbits: Negative

Mastoids and visualized paranasal sinuses: Mild mucosal edema left
maxillary sinus. Remaining sinuses clear.

Skeleton: Cervical disc degeneration and mild spurring. No acute
skeletal abnormality.

Upper chest: Right pneumonectomy. Dilated esophagus with air-fluid
level and retained food. Mild patchy infiltrate left upper lobe.
Chest CT reported separately

Other: None
IMPRESSION: Endotracheal tube in the midtrachea.  No subglottic stenosis.

No retained foreign body or chicken bone. Dilated esophagus with
air-fluid level in retained food.

Negative for mass lesion.

## 2018-06-14 ENCOUNTER — Other Ambulatory Visit: Payer: Self-pay | Admitting: Internal Medicine

## 2018-06-14 DIAGNOSIS — Z1231 Encounter for screening mammogram for malignant neoplasm of breast: Secondary | ICD-10-CM

## 2018-06-16 DIAGNOSIS — H5203 Hypermetropia, bilateral: Secondary | ICD-10-CM | POA: Diagnosis not present

## 2018-06-16 DIAGNOSIS — H43813 Vitreous degeneration, bilateral: Secondary | ICD-10-CM | POA: Diagnosis not present

## 2018-06-16 DIAGNOSIS — H524 Presbyopia: Secondary | ICD-10-CM | POA: Diagnosis not present

## 2018-06-16 DIAGNOSIS — H25813 Combined forms of age-related cataract, bilateral: Secondary | ICD-10-CM | POA: Diagnosis not present

## 2018-07-14 ENCOUNTER — Ambulatory Visit
Admission: RE | Admit: 2018-07-14 | Discharge: 2018-07-14 | Disposition: A | Discharge status: Home / Self Care | Payer: Medicare Other | Source: Ambulatory Visit | Attending: Internal Medicine | Admitting: Internal Medicine

## 2018-07-14 DIAGNOSIS — Z1231 Encounter for screening mammogram for malignant neoplasm of breast: Secondary | ICD-10-CM | POA: Diagnosis not present

## 2018-07-15 ENCOUNTER — Other Ambulatory Visit: Payer: Self-pay | Admitting: Internal Medicine

## 2018-07-15 DIAGNOSIS — R928 Other abnormal and inconclusive findings on diagnostic imaging of breast: Secondary | ICD-10-CM

## 2018-07-20 ENCOUNTER — Ambulatory Visit
Admission: RE | Admit: 2018-07-20 | Discharge: 2018-07-20 | Disposition: A | Discharge status: Home / Self Care | Payer: Medicare Other | Source: Ambulatory Visit | Attending: Internal Medicine | Admitting: Internal Medicine

## 2018-07-20 DIAGNOSIS — R928 Other abnormal and inconclusive findings on diagnostic imaging of breast: Secondary | ICD-10-CM

## 2018-07-20 DIAGNOSIS — N6310 Unspecified lump in the right breast, unspecified quadrant: Secondary | ICD-10-CM | POA: Diagnosis not present

## 2018-08-04 DIAGNOSIS — L820 Inflamed seborrheic keratosis: Secondary | ICD-10-CM | POA: Diagnosis not present

## 2018-08-04 DIAGNOSIS — L821 Other seborrheic keratosis: Secondary | ICD-10-CM | POA: Diagnosis not present

## 2018-08-04 DIAGNOSIS — R52 Pain, unspecified: Secondary | ICD-10-CM | POA: Diagnosis not present

## 2018-08-04 DIAGNOSIS — D1801 Hemangioma of skin and subcutaneous tissue: Secondary | ICD-10-CM | POA: Diagnosis not present

## 2018-08-04 DIAGNOSIS — L814 Other melanin hyperpigmentation: Secondary | ICD-10-CM | POA: Diagnosis not present

## 2018-08-04 DIAGNOSIS — L570 Actinic keratosis: Secondary | ICD-10-CM | POA: Diagnosis not present

## 2018-08-04 DIAGNOSIS — L720 Epidermal cyst: Secondary | ICD-10-CM | POA: Diagnosis not present

## 2018-08-04 DIAGNOSIS — D225 Melanocytic nevi of trunk: Secondary | ICD-10-CM | POA: Diagnosis not present

## 2018-08-04 DIAGNOSIS — Z85828 Personal history of other malignant neoplasm of skin: Secondary | ICD-10-CM | POA: Diagnosis not present

## 2018-08-04 DIAGNOSIS — C44719 Basal cell carcinoma of skin of left lower limb, including hip: Secondary | ICD-10-CM | POA: Diagnosis not present

## 2018-08-17 ENCOUNTER — Ambulatory Visit (INDEPENDENT_AMBULATORY_CARE_PROVIDER_SITE_OTHER): Payer: Medicare Other

## 2018-08-17 DIAGNOSIS — Z23 Encounter for immunization: Secondary | ICD-10-CM

## 2018-08-18 DIAGNOSIS — C44719 Basal cell carcinoma of skin of left lower limb, including hip: Secondary | ICD-10-CM | POA: Diagnosis not present

## 2018-08-18 DIAGNOSIS — Z85828 Personal history of other malignant neoplasm of skin: Secondary | ICD-10-CM | POA: Diagnosis not present

## 2018-12-18 DEATH — deceased

## 2018-12-21 DIAGNOSIS — I469 Cardiac arrest, cause unspecified: Secondary | ICD-10-CM | POA: Diagnosis not present

## 2018-12-21 DIAGNOSIS — T300 Burn of unspecified body region, unspecified degree: Secondary | ICD-10-CM | POA: Diagnosis not present

## 2018-12-21 DIAGNOSIS — R404 Transient alteration of awareness: Secondary | ICD-10-CM | POA: Diagnosis not present

## 2018-12-22 DIAGNOSIS — T300 Burn of unspecified body region, unspecified degree: Secondary | ICD-10-CM | POA: Diagnosis not present

## 2018-12-22 DIAGNOSIS — I469 Cardiac arrest, cause unspecified: Secondary | ICD-10-CM | POA: Diagnosis not present

## 2018-12-22 DIAGNOSIS — R404 Transient alteration of awareness: Secondary | ICD-10-CM | POA: Diagnosis not present

## 2018-12-26 ENCOUNTER — Telehealth: Payer: Self-pay | Admitting: *Deleted

## 2018-12-26 NOTE — Telephone Encounter (Signed)
Received original D/C from [facility]-D/C forwarded to Dr.Burns to be signed.

## 2018-12-27 NOTE — Telephone Encounter (Signed)
Received Signed D/C - [facility] notified for pick up.

## 2019-01-18 DIAGNOSIS — 419620001 Death: Secondary | SNOMED CT | POA: Diagnosis not present

## 2019-01-18 DEATH — deceased
